# Patient Record
Sex: Female | Born: 1970 | Race: White | Hispanic: No | Smoking: Current every day smoker
Health system: Southern US, Community
[De-identification: ages and names within clinical notes are randomized; demographics above are authoritative.]

## PROBLEM LIST (undated history)

## (undated) DIAGNOSIS — T7840XA Allergy, unspecified, initial encounter: Secondary | ICD-10-CM

## (undated) DIAGNOSIS — M1990 Unspecified osteoarthritis, unspecified site: Secondary | ICD-10-CM

## (undated) DIAGNOSIS — F329 Major depressive disorder, single episode, unspecified: Secondary | ICD-10-CM

## (undated) DIAGNOSIS — M797 Fibromyalgia: Secondary | ICD-10-CM

## (undated) DIAGNOSIS — F32A Depression, unspecified: Secondary | ICD-10-CM

## (undated) DIAGNOSIS — F419 Anxiety disorder, unspecified: Secondary | ICD-10-CM

## (undated) HISTORY — DX: Depression, unspecified: F32.A

## (undated) HISTORY — DX: Unspecified osteoarthritis, unspecified site: M19.90

## (undated) HISTORY — DX: Anxiety disorder, unspecified: F41.9

## (undated) HISTORY — DX: Fibromyalgia: M79.7

## (undated) HISTORY — DX: Major depressive disorder, single episode, unspecified: F32.9

## (undated) HISTORY — DX: Allergy, unspecified, initial encounter: T78.40XA

## (undated) HISTORY — PX: SPINE SURGERY: SHX786

---

## 1998-01-03 ENCOUNTER — Inpatient Hospital Stay (HOSPITAL_COMMUNITY): Admission: AD | Admit: 1998-01-03 | Discharge: 1998-01-03 | Payer: Self-pay | Admitting: Obstetrics & Gynecology

## 2002-09-27 ENCOUNTER — Encounter: Admission: RE | Admit: 2002-09-27 | Discharge: 2002-11-01 | Payer: Self-pay | Admitting: Orthopaedic Surgery

## 2002-10-18 ENCOUNTER — Encounter: Payer: Self-pay | Admitting: Orthopaedic Surgery

## 2002-10-18 ENCOUNTER — Ambulatory Visit (HOSPITAL_COMMUNITY): Admission: RE | Admit: 2002-10-18 | Discharge: 2002-10-19 | Payer: Self-pay | Admitting: Orthopaedic Surgery

## 2002-10-19 ENCOUNTER — Encounter: Payer: Self-pay | Admitting: Orthopaedic Surgery

## 2007-02-22 ENCOUNTER — Ambulatory Visit (HOSPITAL_BASED_OUTPATIENT_CLINIC_OR_DEPARTMENT_OTHER): Admission: RE | Admit: 2007-02-22 | Discharge: 2007-02-22 | Payer: Self-pay | Admitting: Family Medicine

## 2007-02-26 ENCOUNTER — Ambulatory Visit: Payer: Self-pay | Admitting: Internal Medicine

## 2011-01-12 NOTE — Procedures (Signed)
[name], [name]                ACCOUNT NO.:  [id number]   MEDICAL RECORD NO.:  [id number]          PATIENT TYPE:  OUT   LOCATION:  SLEEP CENTER                 FACILITY:  [facility]   PHYSICIAN:  Clinton D. Maple Hudson, MD, FCCP, FACPDATE OF BIRTH:  Apr 16[birth year] 1971   DATE OF STUDY:  02/22/2007                            NOCTURNAL POLYSOMNOGRAM   REFERRING PHYSICIAN:  Domenica Fail   INDICATION FOR STUDY:  Insomnia with sleep apnea.   EPWORTH SLEEPINESS SCORE:  14/24, BMI 43, weight 260 pounds.   HOME MEDICATIONS:  Lyrica.   SLEEP ARCHITECTURE:  Total sleep time 299 minutes with sleep efficiency  70%.  Stage I was 2%, stage II 64%, stages III and IV 16%, REM 19% of  total sleep time.  Sleep latency 97 minutes, REM latency 81 minutes,  awake after sleep onset 31 minutes.  Arousal index 33.9.  Lyrica was  taken at bedtime.  Lights were out at 10:20 p.m. with sleep onset at  11:57 p.m.   RESPIRATORY DATA:  Diagnostic NPSG protocol requested.  Apnea/hypopnea  index (AHI, RDI) 39.7 obstructive events per hour indicating moderate  obstructive sleep apnea/hypopnea syndrome.  This included 1 central  apnea and 197 hypopnea.  All events clustered after 2 a.m.  Most events  occurred while sleeping supine, suggesting a positional correlation.  REM AHI 1.1.   OXYGEN DATA:  Mild to moderate snoring with oxygen desaturation to a  nadir of 76%.  Mean oxygen saturation through the study was 94% on room  air.   CARDIAC DATA:  Normal sinus rhythm.   MOVEMENT-PARASOMNIA:  A total of 83 limb jerks were recorded of which 31  were associated with arousal or awakening for a periodic limb movement  with arousal index of 6.2 per hour which is significant.   IMPRESSIONS-RECOMMENDATIONS:  1. Delayed sleep onset with a sleep latency of 1 hour after lights out      suggesting an insomnia component.  2. Moderate obstructive sleep apnea/hypopnea syndrome, AHI 37.7 per      hour with all events being hypopneas rather  than complete apneas      and most events noted while sleeping supine, suggesting opportunity      for position change to reduce events.  Mild to moderate snoring      with oxygen desaturation to a nadir of 76%.  3. Scores in this range can be addressed with CPAP if more      conservative measures are insufficient by scheduling return for      CPAP titration study if needed.  4. Periodic limb movement with arousal syndrome, 6.2 times per hour.      There might be some benefit obtained from a trial of a specific      therapy such as Requip or Mirapex if appropriate.      Clinton D. Maple Hudson, MD, [facility], FACP  Diplomate, Biomedical engineer of Sleep Medicine  Electronically Signed     CDY/MEDQ  D:  02/26/2007 12:12:26  T:  02/27/2007 02:41:04  Job:  [id number]

## 2011-01-15 NOTE — H&P (Signed)
[name]                          ACCOUNT NO.:  [id number]   MEDICAL RECORD NO.:  [id number]                   PATIENT TYPE:  OIB   LOCATION:  2550                                 FACILITY:  [facility]   PHYSICIAN:  [name], M.D.                     DATE OF BIRTH:  Sep 26, 1970   DATE OF ADMISSION:  10/18/2002  DATE OF DISCHARGE:                                HISTORY & PHYSICAL   CHIEF COMPLAINT:  Neck and right upper extremity pain.   HISTORY OF PRESENT ILLNESS:  The patient is a 40 year old female who has had  interscapular right shoulder and arm pain with numbness into the fingers  which has been going on since September of 2003, beginning back in early  January.  The symptoms got significantly worse.  It got to the point that  she was unable to find any positions of comfort.  This pain was near  constant in nature.  It was severe.  It was interfering with her activities  of daily living and quality of life.  She failed conservative management.  Risks and benefits of proposed surgery were discussed with the patient by  Dr. Sharolyn Campbell, as well as myself.  She indicated understanding and opted to  proceed.   ALLERGIES:  No known drug allergies.   MEDICATIONS:  1. Flexeril p.r.n.  2. Vicodin p.r.n.   PAST MEDICAL HISTORY:  Healthy.   PAST SURGICAL HISTORY:  None.   SOCIAL HISTORY:  The patient smokes one pack of cigarettes per day.  Denies  alcohol use.  She is single.  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  She has two children.  She has a family to help her through her  postoperative course.   FAMILY MEDICAL HISTORY:  Mother alive at age 58 with diabetes.  Father  deceased at age 31 secondary to congestive heart failure.   REVIEW OF SYSTEMS:  Patient denies any fevers, chills, sweats, or bleeding  tendencies.  CNS:  Denies blurred vision, double vision, seizures, headache,  paralysis.  CARDIOVASCULAR:  Denies chest pain, angina, orthopnea,  claudication,  or palpitations.  PULMONARY:  Denies shortness of breath,  productive cough, or hemoptysis.  GI:  Denies nausea, vomiting,  constipation, diarrhea, melena, or __________.  Denies dysuria, hematuria,  discharge.  MUSCULOSKELETAL:  As per HPI.   PHYSICAL EXAMINATION:  VITAL SIGNS:  Blood pressure 120/84, respirations 15  and unlabored, pulse 80 and regular.  GENERAL APPEARANCE:  The patient is a 40 year old white female who is alert  and oriented, in no acute distress.  She is well-nourished, well-groomed.  Appears her stated age.  Pleasant and cooperative to examination.  HEENT:  Head is normocephalic and atraumatic.  Pupils equal, round and  reactive.  Extraocular movements are intact.  Nares are patent.  Pharynx is  clear.  NECK:  Soft to palpation.  No bruits  appreciated.  No lymphadenopathy or  thyromegaly noted.  CHEST:  Clear to auscultation bilaterally.  No rales, rhonchi, wheezes, or  friction rubs.  BREASTS:  Not performed.  HEART:  S1 and S2.  Regular rate and rhythm.  No murmurs, gallops, or rubs  noted.  Distant heart sounds.  ABDOMEN:  Soft to palpation.  Nontender, nondistended.  No organomegaly  noted.  GU:  Not pertinent, not performed.  EXTREMITIES:  Grossly intact motor function and sensation on exam.  Radial  pulses are intact and equal bilaterally.  Skin is intact on evaluation.  MRI  did show C6-7 neural foraminal narrowing and HNP of C6-7.   IMPRESSION:  Herniated nucleus pulposus of C6-7.   PLAN:  1. Admit to [facility] on 10/18/02 for an ACDF of C6-7 to be done by     Dr. Sharolyn Campbell.  2. The patient's primary care physician is Dr. Arvilla Campbell.     [name], P.A.                       [name], M.D.    CM/MEDQ  D:  10/18/2002  T:  10/18/2002  Job:  [id number]

## 2011-01-15 NOTE — Op Note (Signed)
[name], [name]                          ACCOUNT NO.:  [id number]   MEDICAL RECORD NO.:  [id number]                   PATIENT TYPE:  OIB   LOCATION:  3014                                 FACILITY:  [facility]   PHYSICIAN:  Sharolyn Douglas, M.D.                     DATE OF BIRTH:  1971-07-04   DATE OF PROCEDURE:  10/18/2002  DATE OF DISCHARGE:                                 OPERATIVE REPORT   PREOPERATIVE DIAGNOSES:  1. C6-7 herniated nucleus pulposus.  2. Right C7 radiculopathy.   POSTOPERATIVE DIAGNOSES:  1. C6-7 herniated nucleus pulposus.  2. Right C7 radiculopathy.   PROCEDURES:  1. Anterior cervical diskectomy C6-7 with decompression of the spinal cord     and nerve roots.  2. Anterior cervical arthrodesis C6-7 with placement of a 9 mm Synthes     Allograft prosthesis spacer.  3. Anterior cervical plating utilizing the EBI VueLock system C6-7.   SURGEON:  Patricia Nettle, M.D.   ASSISTANT:  Colleen P. Mahar, P.A.-C.   ANESTHESIA:  General endotracheal.   COMPLICATIONS:  None.   INDICATIONS FOR PROCEDURE:  This is a 40 year old female with a 5-6 month  history of progressively worsening right upper extremity pain consistent  with a C7 radiculopathy. She had progressive weakness and decreased  sensation. MRI scan showed a C6-7 disc protrusion. She failed a long and  appropriate course of conservative treatment and elected to undergo anterior  cervical diskectomy and fusion at C6-7 in hopes of improving her  symptomatology.   DESCRIPTION OF PROCEDURE:  The patient was properly identified in the  holding area, taken to the operating room. She underwent general  endotracheal anesthesia without difficulty. She was given prophylactic IV  antibiotics. She was carefully positioned on the operating room table with  the Mayfield headrest. Her neck was placed in slight hyperextension. Five  pounds of halter traction was applied. The neck was prepped and draped in  the usual  sterile fashion.   A 3 cm incision was made in the natural skin crease at the level of the  cricoid cartilage. Dissection was carried down to the platysma. The platysma  was incised sharply. The interval between the strap muscles medially and the  sternocleidomastoid muscle was developed. The carotid sheath, esophagus, and  trachea were identified and protected at all times. The prevertebral space  was entered and identified. The C6-7 level was palpated by the prominent  anterior osteophytes. I replaced the spinal needle at C5-6 so that it could  be visualized on intraoperative x-ray. We were in fact at the C5-6 level.   We then elevated the longus colli muscle bilaterally over the C6-7 disc.  Care was taken to protect the disc above and below. There was a large  overhanging osteophyte. We placed our deep __________ retractor. The  osteophyte was removed with a Leksell rongeur. We performed a  subtotal  diskectomy back to the PLL utilizing curettes and pituitary rongeurs. We  placed Caspar distraction pins and gentle distraction across the C6-7  interspace. We then used a high speed bur to take down the uncovertebral  spur on the right as well as remove the cartilaginous endplate. Disc  material was removed superficial to the PLL on the right side. We then took  down the PLL and removed several small subligamentous pieces of disc again  on the right side. A foraminotomy was completed on the right by using a 2 mm  Kerrison.   When we were done, we could visualize the C7 nerve root, confirm that it was  patent out into the foramen using a blunt probe. We checked the left C7  nerve root and this was found to be patent. The wound was irrigated. We then  placed a 9 mm Synthes Allograft prosthesis spacer that had been packed with  demineralized bone matrix. The prosthesis was carefully tapped into  position. We checked posteriorly with a blunt probe and confirmed that there  was plenty of room  posteriorly. We then placed a 16 mm EBI VueLock plate  with four 12 mm screws. We ensured that each of the locking mechanisms was  engaged. We took an intraoperative x-ray and repeated this three times.  Unfortunately, we could only confirm that we were at the appropriate C6-7  level. Because of the patient's large shoulders, positioning of the plate  and screws could not be determined from the x-ray.   The wound was copiously irrigated. A deep Penrose drain was placed. The  platysma was closed with 2-0 Vicryl suture. The subcutaneous layer was  closed with 3-0 Vicryl followed by a running 4-0 subcuticular Vicryl suture.  Benzoin and Steri-Strips placed. Aspen cervical collar placed.   The patient was extubated without difficulty, transferred to the recovery  room in stable condition able to move her upper and lower extremities.                                               Sharolyn Douglas, M.D.    MC/MEDQ  D:  10/18/2002  T:  10/18/2002  Job:  [id number]

## 2012-08-10 ENCOUNTER — Other Ambulatory Visit: Payer: Self-pay | Admitting: *Deleted

## 2012-11-07 ENCOUNTER — Other Ambulatory Visit: Payer: Self-pay | Admitting: Physician Assistant

## 2012-12-13 ENCOUNTER — Ambulatory Visit: Payer: Self-pay

## 2012-12-13 ENCOUNTER — Ambulatory Visit (INDEPENDENT_AMBULATORY_CARE_PROVIDER_SITE_OTHER): Payer: BC Managed Care – PPO | Admitting: Family Medicine

## 2012-12-13 VITALS — BP 101/69 | HR 77 | Temp 97.9°F | Resp 16 | Ht 67.0 in | Wt 260.0 lb

## 2012-12-13 DIAGNOSIS — R82998 Other abnormal findings in urine: Secondary | ICD-10-CM

## 2012-12-13 DIAGNOSIS — N390 Urinary tract infection, site not specified: Secondary | ICD-10-CM

## 2012-12-13 DIAGNOSIS — R8290 Unspecified abnormal findings in urine: Secondary | ICD-10-CM

## 2012-12-13 DIAGNOSIS — F411 Generalized anxiety disorder: Secondary | ICD-10-CM

## 2012-12-13 DIAGNOSIS — J019 Acute sinusitis, unspecified: Secondary | ICD-10-CM

## 2012-12-13 LAB — POCT UA - MICROSCOPIC ONLY: Casts, Ur, LPF, POC: NEGATIVE

## 2012-12-13 LAB — POCT URINALYSIS DIPSTICK
Blood, UA: NEGATIVE
Glucose, UA: NEGATIVE
Ketones, UA: NEGATIVE
Leukocytes, UA: NEGATIVE
Nitrite, UA: POSITIVE
Protein, UA: NEGATIVE
Urobilinogen, UA: 1
pH, UA: 6

## 2012-12-13 MED ORDER — FLUCONAZOLE 150 MG PO TABS: 150.0000 mg | ORAL_TABLET | Freq: Once | ORAL | Status: DC

## 2012-12-13 MED ORDER — FLUTICASONE PROPIONATE 50 MCG/ACT NA SUSP: 2.0000 | Freq: Every day | NASAL | Status: DC

## 2012-12-13 MED ORDER — LEVOFLOXACIN 500 MG PO TABS: 500.0000 mg | ORAL_TABLET | Freq: Every day | ORAL | Status: DC

## 2012-12-13 MED ORDER — ALPRAZOLAM 0.25 MG PO TABS: 0.2500 mg | ORAL_TABLET | Freq: Two times a day (BID) | ORAL | Status: DC | PRN

## 2012-12-13 NOTE — Progress Notes (Signed)
[facility]:  Office Visit  Chief Complaint:  Chief Complaint  Patient presents with  . Conjunctivitis    Left eye-?'s allergic conjunctivitis-lymph nodes swollen in neck  . Urinary Tract Infection    Strong odor-cloudy-lastg time pt had this she had a UTI-x 2 weeks  . Stress    Pt's mom is in ICU and is having a hard time right now    HPI: [name] is a 42 y.o. female who complains of : 1. UTI sxs-strong odor and cloudy urine x 2 weeks. Denies dysuria, nausea, vomiting. Denies fevers, chills, back pain. Drinking and eating well.  2. 2 mornings with clear dc from left eyes, ears are itchy, sore throat, no cough, no fevers or chills. She has been in ICU with mom. + sinus pressure. No ear pain 3. Has been on Xanax before and was wondering if she can have some for her "nerves" since her mom has been sick. Mom has double PNA, renal failure and possibly new onset DVT, she is really sick. Patient also has a h/o fibromyaglia.   Past Medical History  Diagnosis Date  . Allergy   . Depression   . Arthritis   . Anxiety    Past Surgical History  Procedure Laterality Date  . Spine surgery     History   Social History  . Marital Status: Single    Spouse Name: N/A    Number of Children: N/A  . Years of Education: N/A   Social History Main Topics  . Smoking status: Current Every Day Smoker -- 1.00 packs/day for 25 years  . Smokeless tobacco: None  . Alcohol Use: No  . Drug Use: No  . Sexually Active: Yes    Birth Control/ Protection: IUD   Other Topics Concern  . None   Social History Narrative  . None   Family History  Problem Relation Age of Onset  . Congestive Heart Failure Mother   . COPD Mother   . Kidney disease Mother   . Diabetes Mother    No Known Allergies Prior to Admission medications   Medication Sig Start Date End Date Taking? Authorizing Provider  levonorgestrel (MIRENA) 20 MCG/24HR IUD 1 each by Intrauterine route once.   Yes  Historical Provider, MD     ROS: The patient denies fevers, chills, night sweats, unintentional weight loss, chest pain, palpitations, wheezing, dyspnea on exertion, nausea, vomiting, abdominal pain, dysuria, hematuria, melena, numbness, weakness, or tingling.  All other systems have been reviewed and were otherwise negative with the exception of those mentioned in the HPI and as above.    PHYSICAL EXAM: Filed Vitals:   12/13/12 1317  BP: 101/69  Pulse: 77  Temp: 97.9 F (36.6 C)  Resp: 16   Filed Vitals:   12/13/12 1317  Height: 5\' 7"  (1.702 m)  Weight: 260 lb (117.935 kg)   Body mass index is 40.71 kg/(m^2).  General: Alert, obese white female . , tired appearing HEENT:  Normocephalic, atraumatic, oropharynx patent. TM bulging but no e/o infection, + bil sinus tenderness, no exudates. + erthythema of throat.  Cardiovascular:  Regular rate and rhythm, no rubs murmurs or gallops.  No Carotid bruits, radial pulse intact. No pedal edema.  Respiratory: Clear to auscultation bilaterally.  No wheezes, rales, or rhonchi.  No cyanosis, no use of accessory musculature GI: No organomegaly, abdomen is soft and non-tender, positive bowel sounds.  No masses. Skin: No rashes. Neurologic: Facial musculature symmetric. Psychiatric: Patient  is appropriate throughout our interaction. Lymphatic: No cervical lymphadenopathy Musculoskeletal: Gait intact. No CVA tenderness   LABS: Results for orders placed in visit on 12/13/12  POCT URINALYSIS DIPSTICK      Result Value Range   Color, UA yellow     Clarity, UA clear     Glucose, UA neg     Bilirubin, UA neg     Ketones, UA neg     Spec Grav, UA 1.025     Blood, UA neg     pH, UA 6.0     Protein, UA neg     Urobilinogen, UA 1.0     Nitrite, UA pos     Leukocytes, UA Negative    POCT UA - MICROSCOPIC ONLY      Result Value Range   WBC, Ur, HPF, POC 2-12     RBC, urine, microscopic 3-8     Bacteria, U Microscopic 3+     Mucus, UA  mod     Epithelial cells, urine per micros 0-3     Crystals, Ur, HPF, POC neg     Casts, Ur, LPF, POC neg     Yeast, UA pos       EKG/XRAY:   Primary read interpreted by Dr. Conley Rolls at [facility].   ASSESSMENT/PLAN: Encounter Diagnoses  Name Primary?  . Abnormal urine odor Yes  . UTI (urinary tract infection)   . Acute sinusitis   . GAD (generalized anxiety disorder)    Rx Levaquin 500 mg daily x 10 days for acute sinusitis and also UTI, Diflucan for after she is done with Levaquin Rx Xanax for situational stress/anxiety due to mother's illness Urine cx pending Push fluids F/u prn    Priti Consoli PHUONG, DO 12/13/2012 2:14 PM

## 2012-12-15 LAB — URINE CULTURE: Colony Count: >=100000

## 2013-01-23 ENCOUNTER — Telehealth: Payer: Self-pay

## 2013-01-23 MED ORDER — CIPROFLOXACIN HCL 250 MG PO TABS: 250.0000 mg | ORAL_TABLET | Freq: Two times a day (BID) | ORAL | Status: DC

## 2013-01-23 NOTE — Telephone Encounter (Signed)
Dr Conley Rolls had tried to call pt back. Spoke w/pt and advised her that Dr Conley Rolls is going to send in 3 days of Cipro, but that is the longest she can put her on it d/t a risk of tendon rupture. Advised pt that she needs to RTC is her Sxs remain after taking the Cipro. Pt agreed.

## 2013-01-23 NOTE — Telephone Encounter (Signed)
Pt saw Dr, Conley Rolls last month for a UTI. She took her antibiotics and felt better but once completed UTI is back. She said Dr. Conley Rolls asked her to call if this happened. She has same symptoms and some bleeding-not sure if related (also lost her mother in this timeframe). Wondered if Dr. Conley Rolls would call her in something stronger.  [facility] on [address]  PT [phone]

## 2014-09-03 ENCOUNTER — Ambulatory Visit (INDEPENDENT_AMBULATORY_CARE_PROVIDER_SITE_OTHER): Payer: 59 | Admitting: Family Medicine

## 2014-09-03 ENCOUNTER — Encounter: Payer: Self-pay | Admitting: Family Medicine

## 2014-09-03 VITALS — BP 113/81 | HR 62 | Temp 98.3°F | Resp 16 | Ht 66.5 in | Wt 271.0 lb

## 2014-09-03 DIAGNOSIS — E669 Obesity, unspecified: Secondary | ICD-10-CM

## 2014-09-03 DIAGNOSIS — F17200 Nicotine dependence, unspecified, uncomplicated: Secondary | ICD-10-CM

## 2014-09-03 DIAGNOSIS — R002 Palpitations: Secondary | ICD-10-CM

## 2014-09-03 DIAGNOSIS — Z720 Tobacco use: Secondary | ICD-10-CM

## 2014-09-03 DIAGNOSIS — Z0000 Encounter for general adult medical examination without abnormal findings: Secondary | ICD-10-CM

## 2014-09-03 DIAGNOSIS — Z139 Encounter for screening, unspecified: Secondary | ICD-10-CM

## 2014-09-03 DIAGNOSIS — J302 Other seasonal allergic rhinitis: Secondary | ICD-10-CM

## 2014-09-03 DIAGNOSIS — Z130 Encounter for screening for diseases of the blood and blood-forming organs and certain disorders involving the immune mechanism: Secondary | ICD-10-CM

## 2014-09-03 DIAGNOSIS — Z1389 Encounter for screening for other disorder: Secondary | ICD-10-CM

## 2014-09-03 DIAGNOSIS — L989 Disorder of the skin and subcutaneous tissue, unspecified: Secondary | ICD-10-CM

## 2014-09-03 DIAGNOSIS — Z6841 Body Mass Index (BMI) 40.0 and over, adult: Secondary | ICD-10-CM

## 2014-09-03 LAB — COMPREHENSIVE METABOLIC PANEL
ALT: 13 U/L (ref 0–35)
AST: 13 U/L (ref 0–37)
Albumin: 4 g/dL (ref 3.5–5.2)
Alkaline Phosphatase: 54 U/L (ref 39–117)
BILIRUBIN TOTAL: 0.6 mg/dL (ref 0.2–1.2)
BUN: 10 mg/dL (ref 6–23)
CALCIUM: 8.5 mg/dL (ref 8.4–10.5)
CO2: 21 meq/L (ref 19–32)
CREATININE: 0.7 mg/dL (ref 0.50–1.10)
Chloride: 105 mEq/L (ref 96–112)
GLUCOSE: 94 mg/dL (ref 70–99)
POTASSIUM: 4.3 meq/L (ref 3.5–5.3)
Sodium: 137 mEq/L (ref 135–145)
Total Protein: 6.5 g/dL (ref 6.0–8.3)

## 2014-09-03 LAB — CBC WITH DIFFERENTIAL/PLATELET
BASOS ABS: 0 10*3/uL (ref 0.0–0.1)
Basophils Relative: 0 % (ref 0–1)
EOS ABS: 0.1 10*3/uL (ref 0.0–0.7)
EOS PCT: 1 % (ref 0–5)
HEMATOCRIT: 45.2 % (ref 36.0–46.0)
Hemoglobin: 15.4 g/dL — ABNORMAL HIGH (ref 12.0–15.0)
LYMPHS PCT: 31 % (ref 12–46)
Lymphs Abs: 2.5 10*3/uL (ref 0.7–4.0)
MCH: 30.2 pg (ref 26.0–34.0)
MCHC: 34.1 g/dL (ref 30.0–36.0)
MCV: 88.6 fL (ref 78.0–100.0)
MPV: 11.9 fL (ref 8.6–12.4)
Monocytes Absolute: 0.5 10*3/uL (ref 0.1–1.0)
Monocytes Relative: 6 % (ref 3–12)
NEUTROS ABS: 5 10*3/uL (ref 1.7–7.7)
NEUTROS PCT: 62 % (ref 43–77)
Platelets: 151 10*3/uL (ref 150–400)
RBC: 5.1 MIL/uL (ref 3.87–5.11)
RDW: 13.7 % (ref 11.5–15.5)
WBC: 8.1 10*3/uL (ref 4.0–10.5)

## 2014-09-03 LAB — TSH: TSH: 2.224 u[IU]/mL (ref 0.350–4.500)

## 2014-09-03 LAB — POCT URINALYSIS DIPSTICK
Bilirubin, UA: NEGATIVE
Blood, UA: NEGATIVE
Glucose, UA: NEGATIVE
KETONES UA: NEGATIVE
Nitrite, UA: NEGATIVE
PH UA: 5
PROTEIN UA: NEGATIVE
Spec Grav, UA: 1.015
Urobilinogen, UA: 0.2

## 2014-09-03 LAB — LIPID PANEL
CHOL/HDL RATIO: 5.5 ratio
Cholesterol: 186 mg/dL (ref 0–200)
HDL: 34 mg/dL — AB (ref >39)
LDL Cholesterol: 120 mg/dL — ABNORMAL HIGH (ref 0–99)
TRIGLYCERIDES: 159 mg/dL — AB (ref <150)
VLDL: 32 mg/dL (ref 0–40)

## 2014-09-03 MED ORDER — SERTRALINE HCL 50 MG PO TABS: 50.0000 mg | ORAL_TABLET | Freq: Every day | ORAL | Status: DC

## 2014-09-03 MED ORDER — FLUTICASONE PROPIONATE 50 MCG/ACT NA SUSP: 2.0000 | Freq: Every day | NASAL | Status: AC

## 2014-09-03 MED ORDER — ALPRAZOLAM 0.25 MG PO TABS: 0.2500 mg | ORAL_TABLET | Freq: Two times a day (BID) | ORAL | Status: DC | PRN

## 2014-09-03 NOTE — Progress Notes (Signed)
Subjective:    Patient ID: [name], female    DOB: Jan 08, 1971, 44 y.o.   MRN: [id number]  HPI This is a very pleasant 44 yo female who presents today for CPE.  Last pap- regular with gyn Last mammo- will make her own appointment Flu- declines Tdap- thinks she is up to date, will check Dentist- regularly Exercise- has been working out with a trainer weekly for the last 6 months. Has lost several inches and a clothes size, but has only lost 1 pound. Did not really change her diet until recently. Never has chest pain or unusual SOB with exercise.  She has had increased anxiety and palpitations. She has had a very stressful month. Her husband was out of work until recently. She has about 10 episodes a day, lasting about 30 seconds. She relaxes and the episodes resolve. They have gotten shorter with relaxation. She stopped drinking soda (regular, Mountain Dew,2 liters a day) at the beginning of the year. She stopped this cold [place]. She is drinking some sweet tea.  Has mierena IUD. Has noticed rare hot flashes. Has had sleep disturbance and irritability.   Smokes 1ppd. Has tried to quit in the past. Has been on zoloft which helped with cravings. Is interested in quitting after anxiety/stress improved. Would like to go back on Zoloft to see if it would help with smoking and anxiety. She has had episodes of anxiety in the past. Has used Xanax for sleep and anxiety with good results.  Past Medical History  Diagnosis Date  . Allergy   . Depression   . Arthritis   . Anxiety   . Fibromyalgia    Past Surgical History  Procedure Laterality Date  . Spine surgery     Family History  Problem Relation Age of Onset  . Congestive Heart Failure Mother   . COPD Mother   . Diabetes Mother   . Heart disease Mother   . Hyperlipidemia Mother   . Hypertension Mother   . Diabetes Father   . Heart disease Father   . Hyperlipidemia Father    History  Substance Use Topics  . Smoking status:  Current Every Day Smoker -- 1.00 packs/day for 25 years  . Smokeless tobacco: Not on file  . Alcohol Use: No   Review of Systems  Constitutional: Negative.   HENT: Positive for ear discharge, sinus pressure and sneezing.   Eyes: Negative.   Respiratory: Negative.   Cardiovascular: Negative.   Gastrointestinal: Negative.   Endocrine: Negative.   Genitourinary: Negative.   Musculoskeletal: Negative.   Skin: Negative.   Allergic/Immunologic: Positive for environmental allergies.  Neurological: Negative.   Hematological: Negative.   Psychiatric/Behavioral: Negative.       Objective:   Physical Exam  Constitutional: She is oriented to person, place, and time. She appears well-developed and well-nourished.  HENT:  Head: Normocephalic and atraumatic.  Right Ear: Tympanic membrane, external ear and ear canal normal.  Left Ear: Tympanic membrane, external ear and ear canal normal.  Nose: Mucosal edema present.  Mouth/Throat: Uvula is midline, oropharynx is clear and moist and mucous membranes are normal.  Eyes: Conjunctivae are normal. Pupils are equal, round, and reactive to light.  Neck: Normal range of motion. Neck supple. No JVD present. No thyromegaly present.  Cardiovascular: Normal rate, regular rhythm and normal heart sounds.   Pulmonary/Chest: Effort normal and breath sounds normal.  Abdominal: Soft. Bowel sounds are normal. She exhibits no distension and no mass. There is no  tenderness. There is no rebound and no guarding.  Musculoskeletal: Normal range of motion.  Lymphadenopathy:    She has no cervical adenopathy.  Neurological: She is alert and oriented to person, place, and time. She has normal reflexes.  Skin: Skin is warm and dry.     Psychiatric: She has a normal mood and affect. Her behavior is normal. Judgment and thought content normal.  Vitals reviewed.  BP 113/81 mmHg  Pulse 62  Temp(Src) 98.3 F (36.8 C) (Oral)  Resp 16  Ht 5' 6.5" (1.689 m)  Wt 271 lb  (122.925 kg)  BMI 43.09 kg/m2  SpO2 98% EKG- reviewed with Dr. Cleta Albertsaub- NSR    Assessment & Plan:  1. Annual physical exam  2. BMI 40.0-44.9, adult - Comprehensive metabolic panel - Lipid panel - TSH -provided written and verbal dietary suggestions, encouraging 1/2-1 pound weight loss per week  3. Palpitations - EKG 12-Lead - Comprehensive metabolic panel - Lipid panel  4. Tobacco abuse - Comprehensive metabolic panel  5. Other seasonal allergic rhinitis -has tried flonase in the past, will resume  6. Screening for deficiency anemia - CBC with Differential  7. Screening for nephropathy - CBC with Differential - Comprehensive metabolic panel - Lipid panel  8. Screening for hematuria or proteinuria - POCT urinalysis dipstick  9. Skin lesion of back - Ambulatory referral to Dermatology  10. Anxiety -sertraline 50 mg po qd -Xanax 0.25 mg po BID PRN- discussed potential for dependence and importance to use sparingly -follow up 2 months  Emi Belfasteborah B. Gessner, FNP-BC  [facility], [facility]  09/05/2014 10:08 AM

## 2014-09-03 NOTE — Patient Instructions (Addendum)
Start flonase nasal spray. Use for 1 month to see if it helps Start zoloft, pick stop smoking date for about 3-4 weeks after starting  Smoking Cessation, Tips for Success If you are ready to quit smoking, congratulations! You have chosen to help yourself be healthier. Cigarettes bring nicotine, tar, carbon monoxide, and other irritants into your body. Your lungs, heart, and blood vessels will be able to work better without these poisons. There are many different ways to quit smoking. Nicotine gum, nicotine patches, a nicotine inhaler, or nicotine nasal spray can help with physical craving. Hypnosis, support groups, and medicines help break the habit of smoking. WHAT THINGS CAN I DO TO MAKE QUITTING EASIER?  Here are some tips to help you quit for good:  Pick a date when you will quit smoking completely. Tell all of your friends and family about your plan to quit on that date.  Do not try to slowly cut down on the number of cigarettes you are smoking. Pick a quit date and quit smoking completely starting on that day.  Throw away all cigarettes.   Clean and remove all ashtrays from your home, work, and car.  On a card, write down your reasons for quitting. Carry the card with you and read it when you get the urge to smoke.  Cleanse your body of nicotine. Drink enough water and fluids to keep your urine clear or pale yellow. Do this after quitting to flush the nicotine from your body.  Learn to predict your moods. Do not let a bad situation be your excuse to have a cigarette. Some situations in your life might tempt you into wanting a cigarette.  Never have "just one" cigarette. It leads to wanting another and another. Remind yourself of your decision to quit.  Change habits associated with smoking. If you smoked while driving or when feeling stressed, try other activities to replace smoking. Stand up when drinking your coffee. Brush your teeth after eating. Sit in a different chair when you  read the paper. Avoid alcohol while trying to quit, and try to drink fewer caffeinated beverages. Alcohol and caffeine may urge you to smoke.  Avoid foods and drinks that can trigger a desire to smoke, such as sugary or spicy foods and alcohol.  Ask people who smoke not to smoke around you.  Have something planned to do right after eating or having a cup of coffee. For example, plan to take a walk or exercise.  Try a relaxation exercise to calm you down and decrease your stress. Remember, you may be tense and nervous for the first 2 weeks after you quit, but this will pass.  Find new activities to keep your hands busy. Play with a pen, coin, or rubber band. Doodle or draw things on paper.  Brush your teeth right after eating. This will help cut down on the craving for the taste of tobacco after meals. You can also try mouthwash.   Use oral substitutes in place of cigarettes. Try using lemon drops, carrots, cinnamon sticks, or chewing gum. Keep them handy so they are available when you have the urge to smoke.  When you have the urge to smoke, try deep breathing.  Designate your home as a nonsmoking area.  If you are a heavy smoker, ask your health care provider about a prescription for nicotine chewing gum. It can ease your withdrawal from nicotine.  Reward yourself. Set aside the cigarette money you save and buy yourself something nice.  Look for support from others. Join a support group or smoking cessation program. Ask someone at home or at work to help you with your plan to quit smoking.  Always ask yourself, "Do I need this cigarette or is this just a reflex?" Tell yourself, "Today, I choose not to smoke," or "I do not want to smoke." You are reminding yourself of your decision to quit.  Do not replace cigarette smoking with electronic cigarettes (commonly called e-cigarettes). The safety of e-cigarettes is unknown, and some may contain harmful chemicals.  If you relapse, do not  give up! Plan ahead and think about what you will do the next time you get the urge to smoke. HOW WILL I FEEL WHEN I QUIT SMOKING? You may have symptoms of withdrawal because your body is used to nicotine (the addictive substance in cigarettes). You may crave cigarettes, be irritable, feel very hungry, cough often, get headaches, or have difficulty concentrating. The withdrawal symptoms are only temporary. They are strongest when you first quit but will go away within 10-14 days. When withdrawal symptoms occur, stay in control. Think about your reasons for quitting. Remind yourself that these are signs that your body is healing and getting used to being without cigarettes. Remember that withdrawal symptoms are easier to treat than the major diseases that smoking can cause.  Even after the withdrawal is over, expect periodic urges to smoke. However, these cravings are generally short lived and will go away whether you smoke or not. Do not smoke! WHAT RESOURCES ARE AVAILABLE TO HELP ME QUIT SMOKING? Your health care provider can direct you to community resources or hospitals for support, which may include:  Group support.  Education.  Hypnosis.  Therapy. Document Released: 05/14/2004 Document Revised: 12/31/2013 Document Reviewed: 02/01/2013 [facility] Patient Information 2015 [place], [place]. This information is not intended to replace advice given to you by your health care provider. Make sure you discuss any questions you have with your health care provider.    Why follow it? Research shows. . Those who follow the Mediterranean diet have a reduced risk of heart disease  . The diet is associated with a reduced incidence of Parkinson's and Alzheimer's diseases . People following the diet may have longer life expectancies and lower rates of chronic diseases  . The Dietary Guidelines for Americans recommends the Mediterranean diet as an eating plan to promote health and prevent disease  What Is the  Mediterranean Diet?  . Healthy eating plan based on typical foods and recipes of Mediterranean-style cooking . The diet is primarily a plant based diet; these foods should make up a majority of meals   Starches - Plant based foods should make up a majority of meals - They are an important sources of vitamins, minerals, energy, antioxidants, and fiber - Choose whole grains, foods high in fiber and minimally processed items  - Typical grain sources include wheat, oats, barley, corn, brown rice, bulgar, farro, millet, polenta, couscous  - Various types of beans include chickpeas, lentils, fava beans, black beans, white beans   Fruits  Veggies - Large quantities of antioxidant rich fruits & veggies; 6 or more servings  - Vegetables can be eaten raw or lightly drizzled with oil and cooked  - Vegetables common to the traditional Mediterranean Diet include: artichokes, arugula, beets, broccoli, brussel sprouts, cabbage, carrots, celery, collard greens, cucumbers, eggplant, kale, leeks, lemons, lettuce, mushrooms, okra, onions, peas, peppers, potatoes, pumpkin, radishes, rutabaga, shallots, spinach, sweet potatoes, turnips, zucchini - Fruits common  to the Mediterranean Diet include: apples, apricots, avocados, cherries, clementines, dates, figs, grapefruits, grapes, melons, nectarines, oranges, peaches, pears, pomegranates, strawberries, tangerines  Fats - Replace butter and margarine with healthy oils, such as olive oil, canola oil, and tahini  - Limit nuts to no more than a handful a day  - Nuts include walnuts, almonds, pecans, pistachios, pine nuts  - Limit or avoid candied, honey roasted or heavily salted nuts - Olives are central to the [employer]iterranean diet - can be eaten whole or used in a variety of dishes   Meats Protein - Limiting red meat: no more than a few times a month - When eating red meat: choose lean cuts and keep the portion to the size of deck of cards - Eggs: approx. 0 to 4 times a  week  - Fish and lean poultry: at least 2 a week  - Healthy protein sources include, chicken, [place], lean beef, lamb - Increase intake of seafood such as tuna, salmon, trout, mackerel, shrimp, scallops - Avoid or limit high fat processed meats such as sausage and bacon  Dairy - Include moderate amounts of low fat dairy products  - Focus on healthy dairy such as fat free yogurt, skim milk, low or reduced fat cheese - Limit dairy products higher in fat such as whole or 2% milk, cheese, ice cream  Alcohol - Moderate amounts of red wine is ok  - No more than 5 oz daily for women (all ages) and men older than age 44  - No more than 10 oz of wine daily for men younger than 3665  Other - Limit sweets and other desserts  - Use herbs and spices instead of salt to flavor foods  - Herbs and spices common to the traditional Mediterranean Diet include: basil, bay leaves, chives, cloves, cumin, fennel, garlic, lavender, marjoram, mint, oregano, parsley, pepper, rosemary, sage, savory, sumac, tarragon, thyme   It's not just a diet, it's a lifestyle:  . The Mediterranean diet includes lifestyle factors typical of those in the region  . Foods, drinks and meals are best eaten with others and savored . Daily physical activity is important for overall good health . This could be strenuous exercise like running and aerobics . This could also be more leisurely activities such as walking, housework, yard-work, or taking the stairs . Moderation is the key; a balanced and healthy diet accommodates most foods and drinks . Consider portion sizes and frequency of consumption of certain foods   Meal Ideas & Options:  . Breakfast:  o Whole wheat toast or whole wheat English muffins with peanut butter & hard boiled egg o Steel cut oats topped with apples & cinnamon and skim milk  o Fresh fruit: banana, strawberries, melon, berries, peaches  o Smoothies: strawberries, bananas, greek yogurt, peanut butter o Low fat  greek yogurt with blueberries and granola  o Egg white omelet with spinach and mushrooms o Breakfast couscous: whole wheat couscous, apricots, skim milk, cranberries  . Sandwiches:  o Hummus and grilled vegetables (peppers, zucchini, squash) on whole wheat bread   o Grilled chicken on whole wheat pita with lettuce, tomatoes, cucumbers or tzatziki  o Tuna salad on whole wheat bread: tuna salad made with greek yogurt, olives, red peppers, capers, green onions o Garlic rosemary lamb pita: lamb sauted with garlic, rosemary, salt & pepper; add lettuce, cucumber, greek yogurt to pita - flavor with lemon juice and black pepper  . Seafood:  o Mediterranean grilled salmon, seasoned  with garlic, basil, parsley, lemon juice and black pepper o Shrimp, lemon, and spinach whole-grain pasta salad made with low fat greek yogurt  o Seared scallops with lemon orzo  o Seared tuna steaks seasoned salt, pepper, coriander topped with tomato mixture of olives, tomatoes, olive oil, minced garlic, parsley, green onions and cappers  . Meats:  o Herbed greek chicken salad with kalamata olives, cucumber, feta  o Red bell peppers stuffed with spinach, bulgur, lean ground beef (or lentils) & topped with feta   o Kebabs: skewers of chicken, tomatoes, onions, zucchini, squash  o [place] burgers: made with red onions, mint, dill, lemon juice, feta cheese topped with roasted red peppers . Vegetarian o Cucumber salad: cucumbers, artichoke hearts, celery, red onion, feta cheese, tossed in olive oil & lemon juice  o Hummus and whole grain pita points with a greek salad (lettuce, tomato, feta, olives, cucumbers, red onion) o Lentil soup with celery, carrots made with vegetable broth, garlic, salt and pepper  o Tabouli salad: parsley, bulgur, mint, scallions, cucumbers, tomato, radishes, lemon juice, olive oil, salt and pepper.

## 2014-09-04 ENCOUNTER — Encounter: Payer: Self-pay | Admitting: Family Medicine

## 2014-09-04 ENCOUNTER — Other Ambulatory Visit: Payer: Self-pay | Admitting: Family Medicine

## 2014-09-04 MED ORDER — ATORVASTATIN CALCIUM 10 MG PO TABS: 10.0000 mg | ORAL_TABLET | Freq: Every day | ORAL | Status: DC

## 2014-09-05 DIAGNOSIS — Z6841 Body Mass Index (BMI) 40.0 and over, adult: Secondary | ICD-10-CM

## 2014-09-05 DIAGNOSIS — F17200 Nicotine dependence, unspecified, uncomplicated: Secondary | ICD-10-CM | POA: Insufficient documentation

## 2014-09-09 ENCOUNTER — Other Ambulatory Visit: Payer: Self-pay | Admitting: Family Medicine

## 2014-09-09 DIAGNOSIS — R8290 Unspecified abnormal findings in urine: Secondary | ICD-10-CM

## 2014-09-30 ENCOUNTER — Telehealth: Payer: Self-pay | Admitting: Family Medicine

## 2014-10-01 MED ORDER — ALPRAZOLAM 0.25 MG PO TABS: 0.2500 mg | ORAL_TABLET | Freq: Two times a day (BID) | ORAL | Status: DC | PRN

## 2014-10-01 NOTE — Telephone Encounter (Signed)
Please call patient and tell her that I faxed her refill to her pharmacy.

## 2014-11-05 ENCOUNTER — Ambulatory Visit: Payer: 59 | Admitting: Family Medicine

## 2014-11-13 ENCOUNTER — Ambulatory Visit (INDEPENDENT_AMBULATORY_CARE_PROVIDER_SITE_OTHER): Payer: 59 | Admitting: Family Medicine

## 2014-11-13 ENCOUNTER — Encounter: Payer: Self-pay | Admitting: Family Medicine

## 2014-11-13 VITALS — BP 119/78 | HR 101 | Temp 98.3°F | Resp 18 | Ht 66.0 in | Wt 271.2 lb

## 2014-11-13 DIAGNOSIS — R8299 Other abnormal findings in urine: Secondary | ICD-10-CM | POA: Diagnosis not present

## 2014-11-13 DIAGNOSIS — B373 Candidiasis of vulva and vagina: Secondary | ICD-10-CM

## 2014-11-13 DIAGNOSIS — B3731 Acute candidiasis of vulva and vagina: Secondary | ICD-10-CM

## 2014-11-13 DIAGNOSIS — Z23 Encounter for immunization: Secondary | ICD-10-CM | POA: Diagnosis not present

## 2014-11-13 DIAGNOSIS — Z1239 Encounter for other screening for malignant neoplasm of breast: Secondary | ICD-10-CM

## 2014-11-13 DIAGNOSIS — R05 Cough: Secondary | ICD-10-CM

## 2014-11-13 DIAGNOSIS — F4323 Adjustment disorder with mixed anxiety and depressed mood: Secondary | ICD-10-CM

## 2014-11-13 DIAGNOSIS — R8290 Unspecified abnormal findings in urine: Secondary | ICD-10-CM

## 2014-11-13 DIAGNOSIS — R059 Cough, unspecified: Secondary | ICD-10-CM

## 2014-11-13 LAB — POCT UA - MICROSCOPIC ONLY
Bacteria, U Microscopic: NEGATIVE
CRYSTALS, UR, HPF, POC: NEGATIVE
Casts, Ur, LPF, POC: NEGATIVE
Mucus, UA: NEGATIVE
RBC, URINE, MICROSCOPIC: NEGATIVE
Yeast, UA: NEGATIVE

## 2014-11-13 MED ORDER — VENLAFAXINE HCL ER 37.5 MG PO CP24: 37.5000 mg | ORAL_CAPSULE | Freq: Every day | ORAL | Status: DC

## 2014-11-13 MED ORDER — FLUCONAZOLE 150 MG PO TABS: 150.0000 mg | ORAL_TABLET | Freq: Once | ORAL | Status: DC

## 2014-11-13 MED ORDER — HYDROCODONE-HOMATROPINE 5-1.5 MG/5ML PO SYRP: 5.0000 mL | ORAL_SOLUTION | Freq: Three times a day (TID) | ORAL | Status: DC | PRN

## 2014-11-13 NOTE — Patient Instructions (Signed)
Take 1 effexor daily for 1 week then increase to 2 tablets once a day. Follow up in 2 months Please bring in a 1 week food log  Venlafaxine extended-release capsules What is this medicine? VENLAFAXINE(VEN la fax een) is used to treat depression, anxiety and panic disorder. This medicine may be used for other purposes; ask your health care provider or pharmacist if you have questions. COMMON BRAND NAME(S): Effexor XR What should I tell my health care provider before I take this medicine? They need to know if you have any of these conditions: -bleeding disorders -glaucoma -heart disease -high blood pressure -high cholesterol -kidney disease -liver disease -low levels of sodium in the blood -mania or bipolar disorder -seizures -suicidal thoughts, plans, or attempt; a previous suicide attempt by you or a family -take medicines that treat or prevent blood clots -thyroid disease -an unusual or allergic reaction to venlafaxine, desvenlafaxine, other medicines, foods, dyes, or preservatives -pregnant or trying to get pregnant -breast-feeding How should I use this medicine? Take this medicine by mouth with a full glass of water. Follow the directions on the prescription label. Do not cut, crush, or chew this medicine. Take it with food. If needed, the capsule may be carefully opened and the entire contents sprinkled on a spoonful of cool applesauce. Swallow the applesauce/pellet mixture right away without chewing and follow with a glass of water to ensure complete swallowing of the pellets. Try to take your medicine at about the same time each day. Do not take your medicine more often than directed. Do not stop taking this medicine suddenly except upon the advice of your doctor. Stopping this medicine too quickly may cause serious side effects or your condition may worsen. A special MedGuide will be given to you by the pharmacist with each prescription and refill. Be sure to read this information  carefully each time. Talk to your pediatrician regarding the use of this medicine in children. Special care may be needed. Overdosage: If you think you have taken too much of this medicine contact a poison control center or emergency room at once. NOTE: This medicine is only for you. Do not share this medicine with others. What if I miss a dose? If you miss a dose, take it as soon as you can. If it is almost time for your next dose, take only that dose. Do not take double or extra doses. What may interact with this medicine? Do not take this medicine with any of the following medications: -certain medicines for fungal infections like fluconazole, itraconazole, ketoconazole, posaconazole, voriconazole -cisapride -desvenlafaxine -dofetilide -dronedarone -duloxetine -levomilnacipran -linezolid -MAOIs like Carbex, Eldepryl, Marplan, Nardil, and Parnate -methylene blue (injected into a vein) -milnacipran -pimozide -thioridazine -ziprasidone This medicine may also interact with the following medications: -aspirin and aspirin-like medicines -certain medicines for depression, anxiety, or psychotic disturbances -certain medicines for migraine headaches like almotriptan, eletriptan, frovatriptan, naratriptan, rizatriptan, sumatriptan, zolmitriptan -certain medicines for sleep -certain medicines that treat or prevent blood clots like dalteparin, enoxaparin, warfarin -cimetidine -clozapine -diuretics -fentanyl -furazolidone -indinavir -isoniazid -lithium -metoprolol -NSAIDS, medicines for pain and inflammation, like ibuprofen or naproxen -other medicines that prolong the QT interval (cause an abnormal heart rhythm) -procarbazine -rasagiline -supplements like St. John's wort, kava kava, valerian -tramadol -tryptophan This list may not describe all possible interactions. Give your health care provider a list of all the medicines, herbs, non-prescription drugs, or dietary supplements you  use. Also tell them if you smoke, drink alcohol, or use illegal drugs. Some items  may interact with your medicine. What should I watch for while using this medicine? Tell your doctor if your symptoms do not get better or if they get worse. Visit your doctor or health care professional for regular checks on your progress. Because it may take several weeks to see the full effects of this medicine, it is important to continue your treatment as prescribed by your doctor. Patients and their families should watch out for new or worsening thoughts of suicide or depression. Also watch out for sudden changes in feelings such as feeling anxious, agitated, panicky, irritable, hostile, aggressive, impulsive, severely restless, overly excited and hyperactive, or not being able to sleep. If this happens, especially at the beginning of treatment or after a change in dose, call your health care professional. This medicine can cause an increase in blood pressure. Check with your doctor for instructions on monitoring your blood pressure while taking this medicine. You may get drowsy or dizzy. Do not drive, use machinery, or do anything that needs mental alertness until you know how this medicine affects you. Do not stand or sit up quickly, especially if you are an older patient. This reduces the risk of dizzy or fainting spells. Alcohol may interfere with the effect of this medicine. Avoid alcoholic drinks. Your mouth may get dry. Chewing sugarless gum, sucking hard candy and drinking plenty of water will help. Contact your doctor if the problem does not go away or is severe. What side effects may I notice from receiving this medicine? Side effects that you should report to your doctor or health care professional as soon as possible: -allergic reactions like skin rash, itching or hives, swelling of the face, lips, or tongue -breathing problems -changes in vision -hallucination, loss of contact with  reality -seizures -suicidal thoughts or other mood changes -trouble passing urine or change in the amount of urine -unusual bleeding or bruising Side effects that usually do not require medical attention (report to your doctor or health care professional if they continue or are bothersome): -change in sex drive or performance -constipation -increased sweating -loss of appetite -nausea -tremors -weight loss This list may not describe all possible side effects. Call your doctor for medical advice about side effects. You may report side effects to FDA at 1-800-FDA-1088. Where should I keep my medicine? Keep out of the reach of children. Store at a controlled temperature between 20 and 25 degrees C (68 degrees and 77 degrees F), in a dry place. Throw away any unused medicine after the expiration date. NOTE: This sheet is a summary. It may not cover all possible information. If you have questions about this medicine, talk to your doctor, pharmacist, or health care provider.  2015, [employer]. (2013-03-13 12:46:03)

## 2014-11-13 NOTE — Progress Notes (Signed)
Subjective:    Patient ID: [name], female    DOB: December 25, 1970, 44 y.o.   MRN: [id number]  HPI Patient presents today for follow up of depression and anxiety. She reports that she is not having any more panic attacks. She had to stop Zoloft secondary to nightmares. She is interested in trying another daily medication for her anxiety symptoms. She continues to feel like she is constantly wound up. She is taking Xanax occasionally. Feels very stressed. Has IUD and does not menstruate.   She had sinusitis and and took a course of Augmentin with good resolution of symptoms other than persistent cough. She now has a vaginal yeast infection and requests diflucan. She has also noticed that her urinary symptoms of odor and cloudiness persist. She denies dysuria, hematuria or frequency.   She continues to be frustrated that her weight loss efforts are unsuccessful. She has consistently been working out with a trainer several days a week for longer than 6 months. She feels she is watching what she eats most of the time, trying to incorporate more fruits and vegetables into her diet. She has decreased her portions. Doesn't always sleep well. Usually feels rested if she sleeps through the night.   Review of Systems No fever, no chills, no SOB, no chest pain, +thick white vaginal discharge, +vaginal itching, no vaginal odor.    Objective:   Physical Exam  Constitutional: She is oriented to person, place, and time. She appears well-developed and well-nourished.  HENT:  Head: Normocephalic and atraumatic.  Right Ear: External ear normal.  Left Ear: External ear normal.  Nose: Nose normal.  Mouth/Throat: Oropharynx is clear and moist. No oropharyngeal exudate.  Eyes: Conjunctivae are normal.  Neck: Normal range of motion. Neck supple.  Cardiovascular: Normal rate, regular rhythm and normal heart sounds.   Pulmonary/Chest: Effort normal and breath sounds normal.  Musculoskeletal: Normal range of  motion.  Neurological: She is alert and oriented to person, place, and time.  Skin: Skin is warm and dry.  Psychiatric: She has a normal mood and affect. Her behavior is normal. Judgment and thought content normal.  Brighter mood today.   Vitals reviewed.  BP 119/78 mmHg  Pulse 101  Temp(Src) 98.3 F (36.8 C) (Oral)  Resp 18  Ht  (1.676 m)  Wt 271 lb 3.2 oz (123.016 kg)  BMI 43.79 kg/m2  SpO2 98%     Assessment & Plan:  1. Cloudy urine - POCT UA - Microscopic Only  2. Candidal vaginitis - POCT UA - Microscopic Only - diflucan 150 mg po x 1, may repeat if necessary  3. Cough - hycodan 5 ml q8 h PRN. Patient with hydrocodone intolerance- makes her nauseous. Has tolerated Hycodan in the past.   4. Screening for breast cancer - MM Digital Screening; Future - POCT UA - Microscopic Only  5. Need for Tdap vaccination - Tdap vaccine greater than or equal to 7yo IM - POCT UA - Microscopic Only  6. Flu vaccine need - Flu Vaccine QUAD 36+ mos IM - POCT UA - Microscopic Only  7. Adjustment disorder with mixed anxiety and depressed mood - venlafaxine XR (EFFEXOR XR) 37.5 MG 24 hr capsule; Take 1 capsule (37.5 mg total) by mouth daily with breakfast. Then increase to 2 capsules daily.  Dispense: 60 capsule; Refill: 1 - she will let me know how she does on the Effexor. Discussed importance of self care, adequate sleep, regular exercise, relaxation - follow up in  6-8 weeks  8. Obesity - discussed dietary changes and importance of adequate sleep and stress management - she will bring in a week long food log to her next visit  Emi Belfasteborah B. Gessner, FNP-BC  [facility]  11/16/2014 1:31 PM

## 2014-12-05 ENCOUNTER — Ambulatory Visit: Payer: Self-pay

## 2014-12-17 ENCOUNTER — Telehealth: Payer: Self-pay | Admitting: Family Medicine

## 2014-12-17 NOTE — Telephone Encounter (Signed)
lmom for patient to call us back [name] need her patient to come into office with an earlier appt her clinic will end at [phone] on 01/13/15

## 2014-12-19 ENCOUNTER — Ambulatory Visit
Admission: RE | Admit: 2014-12-19 | Discharge: 2014-12-19 | Disposition: A | Discharge status: Home / Self Care | Payer: Self-pay | Source: Ambulatory Visit | Attending: Family Medicine | Admitting: Family Medicine

## 2014-12-19 DIAGNOSIS — Z1239 Encounter for other screening for malignant neoplasm of breast: Secondary | ICD-10-CM

## 2014-12-20 ENCOUNTER — Other Ambulatory Visit: Payer: Self-pay | Admitting: Family Medicine

## 2014-12-20 DIAGNOSIS — R928 Other abnormal and inconclusive findings on diagnostic imaging of breast: Secondary | ICD-10-CM

## 2014-12-27 ENCOUNTER — Other Ambulatory Visit: Payer: 59

## 2015-01-07 ENCOUNTER — Other Ambulatory Visit: Payer: Self-pay | Admitting: Family Medicine

## 2015-01-07 ENCOUNTER — Other Ambulatory Visit: Payer: Self-pay

## 2015-01-07 DIAGNOSIS — R928 Other abnormal and inconclusive findings on diagnostic imaging of breast: Secondary | ICD-10-CM

## 2015-01-08 ENCOUNTER — Ambulatory Visit
Admission: RE | Admit: 2015-01-08 | Discharge: 2015-01-08 | Disposition: A | Discharge status: Home / Self Care | Payer: 59 | Source: Ambulatory Visit | Attending: Family Medicine | Admitting: Family Medicine

## 2015-01-08 ENCOUNTER — Encounter: Payer: Self-pay | Admitting: Family Medicine

## 2015-01-08 DIAGNOSIS — R928 Other abnormal and inconclusive findings on diagnostic imaging of breast: Secondary | ICD-10-CM

## 2015-01-09 ENCOUNTER — Other Ambulatory Visit: Payer: Self-pay | Admitting: Family Medicine

## 2015-01-09 DIAGNOSIS — F4323 Adjustment disorder with mixed anxiety and depressed mood: Secondary | ICD-10-CM

## 2015-01-09 MED ORDER — VENLAFAXINE HCL ER 75 MG PO CP24: 75.0000 mg | ORAL_CAPSULE | Freq: Every day | ORAL | Status: DC

## 2015-01-13 ENCOUNTER — Ambulatory Visit: Payer: Self-pay | Admitting: Family Medicine

## 2015-01-13 ENCOUNTER — Ambulatory Visit: Payer: 59 | Admitting: Family Medicine

## 2016-02-06 DIAGNOSIS — R0683 Snoring: Secondary | ICD-10-CM | POA: Insufficient documentation

## 2016-02-06 DIAGNOSIS — M797 Fibromyalgia: Secondary | ICD-10-CM | POA: Insufficient documentation

## 2016-05-14 ENCOUNTER — Ambulatory Visit: Payer: 59

## 2016-05-18 ENCOUNTER — Ambulatory Visit (INDEPENDENT_AMBULATORY_CARE_PROVIDER_SITE_OTHER): Payer: Managed Care, Other (non HMO) | Admitting: Family Medicine

## 2016-05-18 VITALS — BP 130/80 | HR 83 | Temp 98.5°F | Resp 17 | Ht 66.0 in | Wt 267.0 lb

## 2016-05-18 DIAGNOSIS — L03012 Cellulitis of left finger: Secondary | ICD-10-CM

## 2016-05-18 DIAGNOSIS — L600 Ingrowing nail: Secondary | ICD-10-CM

## 2016-05-18 MED ORDER — CEPHALEXIN 500 MG PO CAPS: 1000.0000 mg | ORAL_CAPSULE | Freq: Two times a day (BID) | ORAL | 0 refills | Status: DC

## 2016-05-18 NOTE — Progress Notes (Addendum)
Patient ID: [name], female    DOB: 09/27/1970, 45 y.o.   MRN: [id number]  PCP: No primary care provider on file.  Chief Complaint  Patient presents with  . Thumb and toenail problem    Onset 1 year    Subjective:   HPI Presents for evaluation of toe nail pain left great toe and right 2nd toe.  Patient reports a history of both of her great toe nails being removed in the past.  The left great toe re-grew protruding into the lateral side skin of toe. She present today with pain of her left great toe and discomfort of right 2nd toe due to nail has twisted into the skin of the toe. Patient reports attempting to "trim toenail down" but reports increased pain.  Social History   Social History  . Marital status: Single    Spouse name: N/A  . Number of children: N/A  . Years of education: N/A   Occupational History  . 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 Smoking status: Current Every Day Smoker    Packs/day: 1.00    Years: 25.00  . Smokeless tobacco: Not on file  . Alcohol use No  . Drug use: No  . Sexual activity: Yes    Birth control/ protection: IUD   Other Topics Concern  . Not on file   Social History Narrative  . No narrative on file   Family History  Problem Relation Age of Onset  . Congestive Heart Failure Mother   . COPD Mother   . Diabetes Mother   . Heart disease Mother   . Hyperlipidemia Mother   . Hypertension Mother   . Diabetes Father   . Heart disease Father   . Hyperlipidemia Father    Review of Systems See HPI   Patient Active Problem List   Diagnosis Date Noted  . Obesity 09/05/2014  . Tobacco use disorder 09/05/2014     Prior to Admission medications   Medication Sig Start Date End Date Taking? Authorizing Provider  fluticasone (FLONASE) 50 MCG/ACT nasal spray Place 2 sprays into both nostrils daily. 09/03/14  Yes Emi Belfasteborah B Gessner, FNP  levonorgestrel (MIRENA) 20 MCG/24HR IUD 1 each by Intrauterine route once.    Yes Historical Provider, MD  ALPRAZolam (XANAX) 0.25 MG tablet Take 1 tablet (0.25 mg total) by mouth 2 (two) times daily as needed for anxiety. Patient not taking: Reported on 05/18/2016 10/01/14   Emi Belfasteborah B Gessner, FNP  atorvastatin (LIPITOR) 10 MG tablet Take 1 tablet (10 mg total) by mouth daily. Patient not taking: Reported on 05/18/2016 09/04/14   Emi Belfasteborah B Gessner, FNP  sertraline (ZOLOFT) 50 MG tablet Take 1 tablet (50 mg total) by mouth daily. Patient not taking: Reported on 05/18/2016 09/03/14   Emi Belfasteborah B Gessner, FNP  venlafaxine XR (EFFEXOR-XR) 75 MG 24 hr capsule Take 1 capsule (75 mg total) by mouth daily with breakfast. Patient not taking: Reported on 05/18/2016 01/09/15   Emi Belfasteborah B Gessner, FNP     Allergies  Allergen Reactions  . Lyrica [Pregabalin]     Swelling   . Vicodin [Hydrocodone-Acetaminophen]       Objective:  Physical Exam  Constitutional: She is oriented to person, place, and time. She appears well-developed and well-nourished.  HENT:  Head: Normocephalic and atraumatic.  Right Ear: External ear normal.  Left Ear: External ear normal.  Nose: Nose normal.  Eyes: Conjunctivae are normal. Pupils are equal, round, and reactive to  light.  Neck: Normal range of motion. Neck supple.  Cardiovascular: Normal rate.   Pulmonary/Chest: Effort normal and breath sounds normal.  Musculoskeletal: Normal range of motion.  Neurological: She is alert and oriented to person, place, and time.  Skin: Skin is warm and dry.  Deformity of Left Thumb Nail Edematous cuticle. Nail appearance is deviated and grossly deformed, enlarged, compared to right thumb.  Left Great Toe Nail curved inward, top portion of nail partially separated from the nail plate and protruding into skin of the toe.  Right 2nd toe Nail curved inward, top portion of nail partially separated from the nail plate and protruding into the skin of toe.   Psychiatric: She has a normal mood and affect. Her behavior  is normal. Judgment and thought content normal.   Vitals:   05/18/16 1212  BP: 130/80  Pulse: 83  Resp: 17  Temp: 98.5 F (36.9 C)  Toe Nail removal Procedure note: Verbal Consent Obtained. Left great toe wiped with alcohol prep pad, then digital block with 4 cc of 2% Xylocaine with 1 cc of Marcaine. Cleansed and prep with Betadine.  Sterile drape applied. Complete Great Toenail lifted and excised, in its entirety. Proximal aspect of nail bed explored revealing no nail remnants. Hemostasis achieved. Xeroform dressing applied. Cleansed and dressed.  Toe Nail Removal Toe nail removal Procedure note: Verbal Consent Obtained. Right 2nd toe wiped with alcohol prep pad, then digital block with 4 cc of 2% Xylocaine with 1 cc of Marcaine. Cleansed and prep with Betadine.  Sterile drape applied. Complete 2nd toenail lifted and excised, in its entirety. Proximal aspect of nail bed explored revealing no nail remnants. Hemostasis achieved. Xeroform dressing applied. Cleansed and dressed.  Wound care instructions including precautions reviewed with patient.  Assessment & Plan:  1. IGTN (ingrowing toe nail) Patient tolerated procedure TDAP current. If toenail invert upon regrowth, ambulatory referral to podiatry is indicated.  2. Infection of nail bed of finger, left, counseled that the nail was not ingrown. There is likely an abnormality of the nail plate which may be contributing to the deviated growth distribution of the left thumb nail.  - Ambulatory referral to Dermatology  Godfrey Pick. Tiburcio Pea, MSN, FNP-C [facility]

## 2016-05-18 NOTE — Patient Instructions (Addendum)
Woundcare . Keep area clean and dry for 24 hours. Do not remove bandage, if applied. . After 24 hours, remove bandage and wash wound gently with mild soap and warm water. Reapply a new bandage after cleaning wound, if directed. . Continue daily cleansing with soap and water until stitches/staples are removed. . Do not apply any ointments or creams to the wound while stitches/staples are in place, as this may cause delayed healing. . Notify the office if you experience any of the following signs of infection: Swelling, redness, pus drainage, streaking, fever >101.0 F . Notify the office if you experience excessive bleeding that does not stop after 15-20 minutes of constant, firm pressure.    IF you received an x-ray today, you will receive an invoice from [facility]. Please contact [facility] at [phone] with questions or concerns regarding your invoice.   IF you received labwork today, you will receive an invoice from [facility]. Please contact [facility] at [phone] with questions or concerns regarding your invoice.   Our billing staff will not be able to assist you with questions regarding bills from these companies.  You will be contacted with the lab results as soon as they are available. The fastest way to get your results is to activate your My Chart account. Instructions are located on the last page of this paperwork. If you have not heard from us regarding the results in 2 weeks, please contact this office.

## 2016-06-10 DIAGNOSIS — Z23 Encounter for immunization: Secondary | ICD-10-CM | POA: Diagnosis not present

## 2016-06-10 DIAGNOSIS — B379 Candidiasis, unspecified: Secondary | ICD-10-CM | POA: Diagnosis not present

## 2016-06-10 DIAGNOSIS — J0140 Acute pansinusitis, unspecified: Secondary | ICD-10-CM | POA: Diagnosis not present

## 2016-06-10 DIAGNOSIS — T3695XA Adverse effect of unspecified systemic antibiotic, initial encounter: Secondary | ICD-10-CM | POA: Diagnosis not present

## 2016-07-30 DIAGNOSIS — R69 Illness, unspecified: Secondary | ICD-10-CM | POA: Diagnosis not present

## 2016-07-30 DIAGNOSIS — Z0000 Encounter for general adult medical examination without abnormal findings: Secondary | ICD-10-CM | POA: Diagnosis not present

## 2016-07-30 DIAGNOSIS — Z1389 Encounter for screening for other disorder: Secondary | ICD-10-CM | POA: Diagnosis not present

## 2016-07-30 DIAGNOSIS — R5383 Other fatigue: Secondary | ICD-10-CM | POA: Diagnosis not present

## 2016-07-30 DIAGNOSIS — Z87891 Personal history of nicotine dependence: Secondary | ICD-10-CM | POA: Diagnosis not present

## 2016-07-30 DIAGNOSIS — R0602 Shortness of breath: Secondary | ICD-10-CM | POA: Diagnosis not present

## 2016-07-30 DIAGNOSIS — E559 Vitamin D deficiency, unspecified: Secondary | ICD-10-CM | POA: Diagnosis not present

## 2016-08-13 DIAGNOSIS — E559 Vitamin D deficiency, unspecified: Secondary | ICD-10-CM | POA: Diagnosis not present

## 2016-08-13 DIAGNOSIS — E7800 Pure hypercholesterolemia, unspecified: Secondary | ICD-10-CM | POA: Diagnosis not present

## 2016-08-13 DIAGNOSIS — R635 Abnormal weight gain: Secondary | ICD-10-CM | POA: Diagnosis not present

## 2017-04-17 DIAGNOSIS — S91212A Laceration without foreign body of left great toe with damage to nail, initial encounter: Secondary | ICD-10-CM | POA: Diagnosis not present

## 2017-04-17 DIAGNOSIS — L608 Other nail disorders: Secondary | ICD-10-CM | POA: Diagnosis not present

## 2017-04-17 DIAGNOSIS — Z1389 Encounter for screening for other disorder: Secondary | ICD-10-CM | POA: Diagnosis not present

## 2017-04-26 ENCOUNTER — Encounter: Payer: Self-pay | Admitting: Podiatry

## 2017-04-26 ENCOUNTER — Ambulatory Visit (INDEPENDENT_AMBULATORY_CARE_PROVIDER_SITE_OTHER): Payer: Managed Care, Other (non HMO) | Admitting: Podiatry

## 2017-04-26 DIAGNOSIS — L603 Nail dystrophy: Secondary | ICD-10-CM | POA: Diagnosis not present

## 2017-04-26 DIAGNOSIS — L601 Onycholysis: Secondary | ICD-10-CM

## 2017-04-26 NOTE — Patient Instructions (Signed)

## 2017-04-26 NOTE — Progress Notes (Signed)
   Subjective:    Patient ID: [name], female    DOB: 30-Nov-1970, 46 y.o.   MRN: [id number]  HPI  46 year old female presents the office if concerns her left big toenail becoming loose. She states that she hit her toenail on her nephew shoe nail was becoming loose. [place] and she went to urgent care and she is getting ibuprofen as well as antibiotics which she just finished yesterday. She states the nail has become more loose over time since this is his been "tearing" with her sheets and pressure. She said the nails almost off at this point. She denies any drainage or pus. She has also had a history of an injury to the left toenail several years ago which she had a head forward fall on her toe is also had an ingrown toenail that she's had taken off on this side. She has no other concerns today.   Review of Systems  All other systems reviewed and are negative.      Objective:   Physical Exam General: AAO x3, NAD  Dermatological: The left hallux toenails are most complete definitely attached the very proximal nail border. The nail itself is incurvated and the nail is hypertrophic, dystrophic with yellow to brown discoloration. There is no swelling edema, erythema, drainage possibly is no clinical signs of infection present. There is no open lesions or pre-ulcerative lesions of the infected at this time.  Vascular: Dorsalis Pedis artery and Posterior Tibial artery pedal pulses are 2/4 bilateral with immedate capillary fill time. Pedal hair growth present. There is no pain with calf compression, swelling, warmth, erythema.   Neruologic: Grossly intact via light touch bilateral.  Protective threshold with Semmes Wienstein monofilament intact to all pedal sites bilateral.   Musculoskeletal: Tenderness of the left hallux toenail and no other areas of tenderness identified bilaterally. No gross boney pedal deformities bilateral. No pain, crepitus, or limitation noted with foot  and ankle range of motion bilateral. Muscular strength 5/5 in all groups tested bilateral.  Gait: Unassisted, Nonantalgic.     Assessment & Plan:  45 year old female with left hallux onycholysis/onychodystrophy -Treatment options discussed including all alternatives, risks, and complications -Etiology of symptoms were discussed -At this point recommended complete removal of the toenail. Verbal consent was obtained. Under standard conditions a mix of lidocaine and Marcaine plain was infiltrated in a digital block fashion to left hallux toenail. Left hallux toenails prepped. The left hallux toenail was then removed in total without any complications and only very minimal bleeding. Underlying no but appears be healed and there is no open sores identified. Area was cleaned. And buttock ointment and bandages applied. She tolerated well the ankle application. Post injection care was discussed. -I since the toenail for cultures/pathology [facility] labs.  -Discussed the nail will likely so grow back ingrown as well as thickened. She may need partial nail avulsion as the nail grow back in. -RTC 3 weeks or sooner if needed.   Ovid Curd, DPM

## 2017-04-26 NOTE — Addendum Note (Signed)
Addended by: Hadley Pen R on: 04/26/2017 02:26 PM   Modules accepted: Orders

## 2017-05-06 ENCOUNTER — Telehealth: Payer: Self-pay | Admitting: *Deleted

## 2017-05-06 NOTE — Telephone Encounter (Addendum)
-----   Message from Vivi BarrackMatthew R Wagoner, DPM sent at 05/05/2017  4:56 PM EDT ----- Negative for fungus. Please let her know. Will watch to see how the nail grows it. May grow in thicker or discolored form damage.05/06/2017-I informed pt of Dr. Gabriel RungWagoner's review of results and orders.

## 2017-05-17 ENCOUNTER — Ambulatory Visit: Payer: Managed Care, Other (non HMO) | Admitting: Podiatry

## 2017-08-22 DIAGNOSIS — T3695XA Adverse effect of unspecified systemic antibiotic, initial encounter: Secondary | ICD-10-CM | POA: Diagnosis not present

## 2017-08-22 DIAGNOSIS — J0140 Acute pansinusitis, unspecified: Secondary | ICD-10-CM | POA: Diagnosis not present

## 2017-08-22 DIAGNOSIS — B379 Candidiasis, unspecified: Secondary | ICD-10-CM | POA: Diagnosis not present

## 2017-08-22 DIAGNOSIS — H66003 Acute suppurative otitis media without spontaneous rupture of ear drum, bilateral: Secondary | ICD-10-CM | POA: Diagnosis not present

## 2017-11-29 ENCOUNTER — Ambulatory Visit: Payer: 59 | Admitting: Physician Assistant

## 2017-11-29 ENCOUNTER — Other Ambulatory Visit: Payer: Self-pay

## 2017-11-29 ENCOUNTER — Encounter: Payer: Self-pay | Admitting: Physician Assistant

## 2017-11-29 VITALS — BP 134/72 | HR 98 | Temp 98.9°F | Resp 16 | Ht 66.0 in | Wt 268.4 lb

## 2017-11-29 DIAGNOSIS — F4321 Adjustment disorder with depressed mood: Secondary | ICD-10-CM

## 2017-11-29 DIAGNOSIS — F4323 Adjustment disorder with mixed anxiety and depressed mood: Secondary | ICD-10-CM | POA: Insufficient documentation

## 2017-11-29 DIAGNOSIS — R69 Illness, unspecified: Secondary | ICD-10-CM | POA: Diagnosis not present

## 2017-11-29 DIAGNOSIS — F4320 Adjustment disorder, unspecified: Secondary | ICD-10-CM | POA: Insufficient documentation

## 2017-11-29 MED ORDER — VENLAFAXINE HCL ER 37.5 MG PO CP24: 37.5000 mg | ORAL_CAPSULE | Freq: Every day | ORAL | 0 refills | Status: DC

## 2017-11-29 MED ORDER — VENLAFAXINE HCL ER 37.5 MG PO CP24: ORAL_CAPSULE | ORAL | 1 refills | Status: DC

## 2017-11-29 MED ORDER — ALPRAZOLAM 0.25 MG PO TABS: 0.2500 mg | ORAL_TABLET | Freq: Two times a day (BID) | ORAL | 0 refills | Status: DC | PRN

## 2017-11-29 MED ORDER — VENLAFAXINE HCL ER 75 MG PO CP24: 75.0000 mg | ORAL_CAPSULE | Freq: Every day | ORAL | 3 refills | Status: DC

## 2017-11-29 NOTE — Progress Notes (Signed)
Patient ID: Regina ColanderWendy S Campbell, female    DOB: 05/26/1971, 47 y.o.   MRN: [id number]  PCP: Patient, No Pcp Per  Chief Complaint  Patient presents with  . Anxiety    husband passed a month ago, can't sleep     Subjective:   Presents for evaluation of anxiety and grief. 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  Her husband, a patient well known to me, died unexpectedly 10/28/2017 of AMI. He came home from work and told her that he was going to take a shower. After 20 minutes, she wondered what was taking him so long, and found him down, not having made it to the shower. She contacted EMS and he was transported to the hospital (not in our system) where she was advised that his brain had lacked oxygen for too long and that he had brain swelling. She describes herniation, but does not use that term. Her daughter and 2 granddaughters had just moved in with them, so she has family support. 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 Her best friend was with her when she found [name] at home, and continues to be very supportive. She took 2 weeks off work, and while she is glad to be back, has a difficult time staying focused on tasks. Easily distracted by thoughts of Regina Mattendy, continues to cry frequently.  Episodically feels very anxious, with heart palpitations, can't catch her breath,  1/2 alprazolam helps her relax and function, and fall asleep when she can't turn her brain off Previously took sertraline (for stress following her mother's death in 2015 and to reduce cravings for cigarettes, stopped due to nightmares) and venlafaxine (stopped due to financial strain, appears to have worked well based on chart review, but patient doesn't remember taking it).  Review of Systems As above.  Depression screen [facility] 2[facility] 11/29/2017 09/03/2014  Decreased Interest 0 0  Down,  Depressed, Hopeless 0 0  PHQ - 2 Score 0 0    Patient Active Problem List   Diagnosis Date Noted  . Obesity 09/05/2014  . Tobacco use disorder 09/05/2014     Prior to Admission medications   Medication Sig Start Date End Date Taking? Authorizing Provider  ALPRAZolam (XANAX) 0.25 MG tablet Take 1 tablet (0.25 mg total) by mouth 2 (two) times daily as needed for anxiety. 10/01/14  Yes Emi BelfastGessner, Deborah B, FNP  fluticasone (FLONASE) 50 MCG/ACT nasal spray Place 2 sprays into both nostrils daily. 09/03/14  Yes Emi BelfastGessner, Deborah B, FNP  levonorgestrel (MIRENA) 20 MCG/24HR IUD 1 each by Intrauterine route once.   Yes [provider]     Allergies  Allergen Reactions  . Lyrica [Pregabalin]     Swelling   . Vicodin [Hydrocodone-Acetaminophen]        Objective:  Physical Exam  Constitutional: She is oriented to person, place, and time. She appears well-developed and well-nourished. She is active and cooperative. No distress.  BP 134/72   Pulse 98   Temp 98.9 F (37.2 C)   Resp 16   Ht 5\' 6"  (1.676 m)   Wt 268 lb 6.4 oz (121.7 kg)   SpO2 98%   BMI 43.32 kg/m    Eyes: Conjunctivae are normal.  Pulmonary/Chest: Effort normal.  Neurological: She is alert and oriented to person, place, and time.  Psychiatric: Her speech is normal and  behavior is normal. Judgment and thought content normal. Her mood appears not anxious. Her affect is labile. Her affect is not angry, not blunt and not inappropriate. Cognition and memory are normal. She exhibits a depressed mood.   Wt Readings from Last 3 Encounters:  11/29/17 268 lb 6.4 oz (121.7 kg)  05/18/16 267 lb (121.1 kg)  11/13/14 271 lb 3.2 oz (123 kg)       Assessment & Plan:   Problem List Items Addressed This Visit    Adjustment disorder with mixed anxiety and depressed mood   Relevant Medications   ALPRAZolam (XANAX) 0.25 MG tablet   venlafaxine XR (EFFEXOR-XR) 37.5 MG 24 hr capsule   Grief reaction - Primary    Restart  venlafaxine, which chart review reveals she has used successfully in the past, though she does not recall using it. PRN alprazolam. Counseling.          Return in about 2 weeks (around 12/13/2017) for re-evaluation of grief.   Fernande Bras, PA-C [facility]

## 2017-11-29 NOTE — Assessment & Plan Note (Signed)
Restart venlafaxine, which chart review reveals she has used successfully in the past, though she does not recall using it. PRN alprazolam. Counseling.

## 2017-11-29 NOTE — Patient Instructions (Addendum)
For therapy -- [facility] (Beth Kincaid, Ernest McCoy, Heather Kitchens) - [phone] [facility] (Julie Whitt, Terri Bauert) - [phone] [facility] - [phone] [facility] - [phone] Bob Mylan - [phone] Sarah Young - [facility], [phone] [facility]  - [phone] (do not file insurance) Karla Townsend-[phone] Karen Elliot - [phone] Sarah Young - [facility], [phone] [facility] - [phone]   IF you received an x-ray today, you will receive an invoice from [facility]. Please contact  Radiology at [phone] with questions or concerns regarding your invoice.   IF you received labwork today, you will receive an invoice from [facility]. Please contact [facility] at [phone] with questions or concerns regarding your invoice.   Our billing staff will not be able to assist you with questions regarding bills from these companies.  You will be contacted with the lab results as soon as they are available. The fastest way to get your results is to activate your My Chart account. Instructions are located on the last page of this paperwork. If you have not heard from us regarding the results in 2 weeks, please contact this office.      

## 2017-12-01 ENCOUNTER — Encounter: Payer: Self-pay | Admitting: Physician Assistant

## 2017-12-13 ENCOUNTER — Encounter: Payer: Self-pay | Admitting: Physician Assistant

## 2017-12-13 ENCOUNTER — Other Ambulatory Visit: Payer: Self-pay

## 2017-12-13 ENCOUNTER — Ambulatory Visit: Payer: 59 | Admitting: Physician Assistant

## 2017-12-13 VITALS — BP 122/74 | HR 88 | Temp 98.3°F | Resp 16 | Ht 66.0 in | Wt 255.8 lb

## 2017-12-13 DIAGNOSIS — F4321 Adjustment disorder with depressed mood: Secondary | ICD-10-CM

## 2017-12-13 DIAGNOSIS — F4323 Adjustment disorder with mixed anxiety and depressed mood: Secondary | ICD-10-CM

## 2017-12-13 DIAGNOSIS — R69 Illness, unspecified: Secondary | ICD-10-CM | POA: Diagnosis not present

## 2017-12-13 MED ORDER — ALPRAZOLAM 0.25 MG PO TABS: 0.2500 mg | ORAL_TABLET | Freq: Three times a day (TID) | ORAL | 0 refills | Status: DC | PRN

## 2017-12-13 NOTE — Patient Instructions (Signed)
     IF you received an x-ray today, you will receive an invoice from [facility]. Please contact [facility] at [phone] with questions or concerns regarding your invoice.   IF you received labwork today, you will receive an invoice from [facility]. Please contact [facility] at [phone] with questions or concerns regarding your invoice.   Our billing staff will not be able to assist you with questions regarding bills from these companies.  You will be contacted with the lab results as soon as they are available. The fastest way to get your results is to activate your My Chart account. Instructions are located on the last page of this paperwork. If you have not heard from us regarding the results in 2 weeks, please contact this office.     

## 2017-12-13 NOTE — Progress Notes (Signed)
Subjective:    Patient ID: Regina ColanderWendy S Nikkel, female    DOB: 11/18/1970, 47 y.o.   MRN: [id number] Chief Complaint  Patient presents with  . Follow-up    grief    HPI  Has been trying to focus being on her best self and taking care of herself. Doing better at work, able to focus more and get through her day. Reports still having trouble sleeping at night or getting a full nights sleep.  She states venlafaxine has helped her. Feels less emotional. Started taking 75 mg venlafaxine 2 days ago from 37.5mg   Taking alprazolam PRN for anxiety which has been helping.  Reports starting a new diet last week (optavia diet). This has been helpful, meals are already prepared or planned.   Chaplain at work has been helpful (comes to her work every Tuesday). She has very good support system, brother calls daily and son visits 4x/week.  Review of Systems  Constitutional: Negative.   HENT: Negative.   Eyes: Negative.   Respiratory: Negative.   Cardiovascular: Positive for palpitations.  Gastrointestinal: Negative.   Endocrine: Negative.   Genitourinary: Negative.   Hematological: Negative.     Patient Active Problem List   Diagnosis Date Noted  . Adjustment disorder with mixed anxiety and depressed mood 11/29/2017  . Grief reaction 11/29/2017  . Fibromyalgia 02/06/2016  . Snoring 02/06/2016  . Morbid obesity with BMI of 40.0-44.9, adult (HCC) 09/05/2014  . Smoker 09/05/2014   Past Medical History:  Diagnosis Date  . Allergy   . Anxiety   . Arthritis   . Depression   . Fibromyalgia    Prior to Admission medications   Medication Sig Start Date End Date Taking? Authorizing Provider  ALPRAZolam (XANAX) 0.25 MG tablet Take 1 tablet (0.25 mg total) by mouth 2 (two) times daily as needed for anxiety or sleep. 11/29/17  Yes Jeffery, Chelle, PA-C  fluticasone (FLONASE) 50 MCG/ACT nasal spray Place 2 sprays into both nostrils daily. 09/03/14  Yes Emi BelfastGessner, Deborah B, FNP  ibuprofen  (ADVIL,MOTRIN) 800 MG tablet TK 1 T PO TID PRN 04/17/17  Yes [provider]  levonorgestrel (MIRENA) 20 MCG/24HR IUD 1 each by Intrauterine route once.   Yes [provider]  venlafaxine XR (EFFEXOR-XR) 37.5 MG 24 hr capsule Take 1 capsule (37.5 mg total) by mouth daily with breakfast. 11/29/17  Yes Leotis ShamesJeffery, Chelle, PA-C  venlafaxine XR (EFFEXOR-XR) 75 MG 24 hr capsule Take 1 capsule (75 mg total) by mouth daily with breakfast. 11/29/17  Yes Jeffery, Chelle, PA-C   Allergies  Allergen Reactions  . Lyrica [Pregabalin]     Swelling   . Vicodin [Hydrocodone-Acetaminophen]        Objective:   Physical Exam  Constitutional: She is oriented to person, place, and time. She appears well-developed and well-nourished. No distress.  BP 122/74   Pulse 88   Temp 98.3 F (36.8 C)   Resp 16   Ht 5\' 6"  (1.676 m)   Wt 255 lb 12.8 oz (116 kg)   SpO2 96%   BMI 41.29 kg/m    HENT:  Head: Normocephalic and atraumatic.  Eyes: Pupils are equal, round, and reactive to light. Conjunctivae and EOM are normal.  Neck: Normal range of motion. Neck supple. No tracheal deviation present. No thyromegaly present.  Cardiovascular: Normal rate, regular rhythm, normal heart sounds and intact distal pulses. Exam reveals no gallop and no friction rub.  No murmur heard. Pulmonary/Chest: Effort normal and breath sounds normal. No respiratory  distress. She has no wheezes.  Musculoskeletal: Normal range of motion.  Lymphadenopathy:    She has no cervical adenopathy.  Neurological: She is alert and oriented to person, place, and time.  Skin: Skin is warm and dry. She is not diaphoretic.      Office Visit from 12/13/2017 in [facility] at [facility] Total Score  0        Assessment & Plan:  1. Grief reaction See below.  2. Adjustment disorder with mixed anxiety and depressed mood Continue alprazolam 0.25 mg and venlafaxine 75mg . Use therapist options provided at last visit if needed. Try  alprazolam at night to help with sleep.  - ALPRAZolam (XANAX) 0.25 MG tablet; Take 1 tablet (0.25 mg total) by mouth 3 (three) times daily as needed for anxiety or sleep.  Dispense: 60 tablet; Refill: 0

## 2017-12-13 NOTE — Progress Notes (Signed)
Patient ID: Regina ColanderWendy S Campbell, female    DOB: 10/17/1970, 47 y.o.   MRN: [id number]  PCP: Porfirio OarJeffery, Dove Gresham, PA-C  Chief Complaint  Patient presents with  . Follow-up    grief     Subjective:   Presents for evaluation of grief, following the death of her husband and starting venlafaxine.  She is doing better. Feels less emotional on venlafaxine. Just increased the dose from 37.5 mg to 75 mg 2 days ago. Can get through the day better, not having breakdowns at work. Using alprazolam as needed, some days not at all. Continue having trouble with sleep, not getting a full night sleep, doesn't take alprazolam to help her sleep. Talks with the chaplain at work, weekly. Has not scheduled with a therapist, feeling like she has good support. Visits with a good friend and her son has been stopping by 4 times/week.  Using an eating plan, [employer], to help with weight loss. Meals are planned, snacks are provided.    Review of Systems Constitutional: Negative.   HENT: Negative.   Eyes: Negative.   Respiratory: Negative.   Cardiovascular: Positive for palpitations.  Gastrointestinal: Negative.   Endocrine: Negative.   Genitourinary: Negative.   Hematological: Negative.    [facility] 2[facility] 12/13/2017 11/29/2017 09/03/2014  Decreased Interest 0 0 0  Down, Depressed, Hopeless 0 0 0  PHQ - 2 Score 0 0 0       Patient Active Problem List   Diagnosis Date Noted  . Adjustment disorder with mixed anxiety and depressed mood 11/29/2017  . Grief reaction 11/29/2017  . Fibromyalgia 02/06/2016  . Snoring 02/06/2016  . Morbid obesity with BMI of 40.0-44.9, adult (HCC) 09/05/2014  . Smoker 09/05/2014     Prior to Admission medications   Medication Sig Start Date End Date Taking? Authorizing Provider  ALPRAZolam (XANAX) 0.25 MG tablet Take 1 tablet (0.25 mg total) by mouth 2 (two) times daily as needed for anxiety or sleep. 11/29/17  Yes Maleiya Pergola, PA-C  fluticasone (FLONASE) 50  MCG/ACT nasal spray Place 2 sprays into both nostrils daily. 09/03/14  Yes Emi BelfastGessner, Deborah B, FNP  ibuprofen (ADVIL,MOTRIN) 800 MG tablet TK 1 T PO TID PRN 04/17/17  Yes [provider]  levonorgestrel (MIRENA) 20 MCG/24HR IUD 1 each by Intrauterine route once.   Yes [provider]  venlafaxine XR (EFFEXOR-XR) 37.5 MG 24 hr capsule Take 1 capsule (37.5 mg total) by mouth daily with breakfast. 11/29/17  Yes Leotis ShamesJeffery, Brenen Beigel, PA-C  venlafaxine XR (EFFEXOR-XR) 75 MG 24 hr capsule Take 1 capsule (75 mg total) by mouth daily with breakfast. 11/29/17  Yes Anhad Sheeley, PA-C     Allergies  Allergen Reactions  . Lyrica [Pregabalin]     Swelling   . Vicodin [Hydrocodone-Acetaminophen]        Objective:  Physical Exam  Constitutional: She is oriented to person, place, and time. She appears well-developed and well-nourished. She is active and cooperative. No distress.  BP 122/74   Pulse 88   Temp 98.3 F (36.8 C)   Resp 16   Ht 5\' 6"  (1.676 m)   Wt 255 lb 12.8 oz (116 kg)   SpO2 96%   BMI 41.29 kg/m   HENT:  Head: Normocephalic and atraumatic.  Right Ear: Hearing normal.  Left Ear: Hearing normal.  Eyes: Conjunctivae are normal. No scleral icterus.  Neck: Normal range of motion. Neck supple. No thyromegaly present.  Cardiovascular: Normal rate, regular rhythm and normal heart  sounds.  Pulses:      Radial pulses are 2+ on the right side, and 2+ on the left side.  Pulmonary/Chest: Effort normal and breath sounds normal.  Lymphadenopathy:       Head (right side): No tonsillar, no preauricular, no posterior auricular and no occipital adenopathy present.       Head (left side): No tonsillar, no preauricular, no posterior auricular and no occipital adenopathy present.    She has no cervical adenopathy.       Right: No supraclavicular adenopathy present.       Left: No supraclavicular adenopathy present.  Neurological: She is alert and oriented to person, place, and time.  No sensory deficit.  Skin: Skin is warm, dry and intact. No rash noted. No cyanosis or erythema. Nails show no clubbing.  Psychiatric: She has a normal mood and affect. Her speech is normal and behavior is normal.       Wt Readings from Last 3 Encounters:  12/13/17 255 lb 12.8 oz (116 kg)  11/29/17 268 lb 6.4 oz (121.7 kg)  05/18/16 267 lb (121.1 kg)       Assessment & Plan:   Problem List Items Addressed This Visit    Adjustment disorder with mixed anxiety and depressed mood    Doing well on venlafaxine, just increased to 75 mg daily 2 days ago. Continue.      Relevant Medications   ALPRAZolam (XANAX) 0.25 MG tablet   Grief reaction - Primary    Continue current self-care and venlafaxine.          Return in about 3 weeks (around 01/03/2018) for re-evalaution of mood.   Fernande Bras, PA-C Primary Care at [facility]

## 2017-12-17 NOTE — Assessment & Plan Note (Signed)
Doing well on venlafaxine, just increased to 75 mg daily 2 days ago. Continue.

## 2017-12-17 NOTE — Assessment & Plan Note (Signed)
Continue current self-care and venlafaxine.

## 2018-01-03 ENCOUNTER — Ambulatory Visit: Payer: 59 | Admitting: Physician Assistant

## 2018-01-17 ENCOUNTER — Ambulatory Visit (INDEPENDENT_AMBULATORY_CARE_PROVIDER_SITE_OTHER): Payer: 59 | Admitting: Physician Assistant

## 2018-01-17 ENCOUNTER — Other Ambulatory Visit: Payer: Self-pay

## 2018-01-17 ENCOUNTER — Encounter: Payer: Self-pay | Admitting: Physician Assistant

## 2018-01-17 VITALS — BP 120/72 | HR 90 | Temp 98.9°F | Resp 16 | Ht 66.0 in | Wt 245.6 lb

## 2018-01-17 DIAGNOSIS — Z114 Encounter for screening for human immunodeficiency virus [HIV]: Secondary | ICD-10-CM

## 2018-01-17 DIAGNOSIS — Z6841 Body Mass Index (BMI) 40.0 and over, adult: Secondary | ICD-10-CM | POA: Diagnosis not present

## 2018-01-17 DIAGNOSIS — F17200 Nicotine dependence, unspecified, uncomplicated: Secondary | ICD-10-CM

## 2018-01-17 DIAGNOSIS — H65191 Other acute nonsuppurative otitis media, right ear: Secondary | ICD-10-CM | POA: Diagnosis not present

## 2018-01-17 DIAGNOSIS — F4321 Adjustment disorder with depressed mood: Secondary | ICD-10-CM | POA: Diagnosis not present

## 2018-01-17 DIAGNOSIS — R631 Polydipsia: Secondary | ICD-10-CM

## 2018-01-17 DIAGNOSIS — F4323 Adjustment disorder with mixed anxiety and depressed mood: Secondary | ICD-10-CM

## 2018-01-17 DIAGNOSIS — R69 Illness, unspecified: Secondary | ICD-10-CM | POA: Diagnosis not present

## 2018-01-17 LAB — POCT URINALYSIS DIP (MANUAL ENTRY)
Bilirubin, UA: NEGATIVE
GLUCOSE UA: NEGATIVE mg/dL
Nitrite, UA: POSITIVE — AB
Protein Ur, POC: NEGATIVE mg/dL
RBC UA: NEGATIVE
SPEC GRAV UA: 1.02 (ref 1.010–1.025)
UROBILINOGEN UA: 0.2 U/dL
pH, UA: 7.5 (ref 5.0–8.0)

## 2018-01-17 LAB — GLUCOSE, POCT (MANUAL RESULT ENTRY): POC Glucose: 90 mg/dl (ref 70–99)

## 2018-01-17 MED ORDER — AZELASTINE HCL 0.1 % NA SOLN: 2.0000 | Freq: Two times a day (BID) | NASAL | 0 refills | Status: AC

## 2018-01-17 MED ORDER — ALPRAZOLAM 0.25 MG PO TABS: 0.2500 mg | ORAL_TABLET | Freq: Three times a day (TID) | ORAL | 0 refills | Status: DC | PRN

## 2018-01-17 MED ORDER — VENLAFAXINE HCL ER 150 MG PO CP24: 150.0000 mg | ORAL_CAPSULE | Freq: Every day | ORAL | 3 refills | Status: DC

## 2018-01-17 MED ORDER — ALPRAZOLAM 0.25 MG PO TABS
0.2500 mg | ORAL_TABLET | Freq: Three times a day (TID) | ORAL | 0 refills | Status: DC | PRN
Start: 2018-01-17 — End: 2018-03-17

## 2018-01-17 NOTE — Progress Notes (Signed)
Patient ID: [name], female    DOB: 1970-12-27, 47 y.o.   MRN: [id number]  PCP: Porfirio Oar, PA-C  Chief Complaint  Patient presents with  . Mood    doing better     Subjective:   Presents for evaluation of grief reaction, following her husband's unexpected death in 11-16-22.  She is taking venlafaxine 75 mg. Still needing alprazolam at HS, sometimes a second dose.  She is working on self care, healthy lifestyle changes. Using a subscription eating plan, [employer].  Hired an attorney who helped her consolidate her debt into one monthly payment. Her boss paid the attorney fees. Overwhelmed by the love and support she is receiving  2 days of popping in the RIGHT ear. History of recurrent AOM, with resulting scar tissue. She notes that she often cannot tell if she has an infection until it's severe due to altered sensation.  Health Maintenance Due  Topic Date Due  . HIV Screening  08/06/1986  . PAP SMEAR  10/14/2016    Review of Systems As above.  Depression screen [facility] 2/9 01/17/2018 12/13/2017 11/29/2017 09/03/2014  Decreased Interest 0 0 0 0  Down, Depressed, Hopeless 0 0 0 0  PHQ - 2 Score 0 0 0 0     Patient Active Problem List   Diagnosis Date Noted  . Adjustment disorder with mixed anxiety and depressed mood 11/29/2017  . Grief reaction 11/29/2017  . Fibromyalgia 02/06/2016  . Snoring 02/06/2016  . Morbid obesity with BMI of 40.0-44.9, adult (HCC) 09/05/2014  . Smoker 09/05/2014     Prior to Admission medications   Medication Sig Start Date End Date Taking? Authorizing Provider  ALPRAZolam (XANAX) 0.25 MG tablet Take 1 tablet (0.25 mg total) by mouth 3 (three) times daily as needed for anxiety or sleep. 12/13/17  Yes Jaylynn Mcaleer, PA-C  fluticasone (FLONASE) 50 MCG/ACT nasal spray Place 2 sprays into both nostrils daily. 09/03/14  Yes Emi Belfast, FNP  ibuprofen (ADVIL,MOTRIN) 800 MG tablet TK 1 T PO TID PRN 04/17/17  Yes [provider]  levonorgestrel (MIRENA) 20 MCG/24HR IUD 1 each by Intrauterine route once.   Yes [provider]  venlafaxine XR (EFFEXOR-XR) 75 MG 24 hr capsule Take 1 capsule (75 mg total) by mouth daily with breakfast. 11/29/17  Yes Leotis Shames, Mahala Rommel, PA-C     Allergies  Allergen Reactions  . Lyrica [Pregabalin]     Swelling   . Vicodin [Hydrocodone-Acetaminophen]        Objective:  Physical Exam  Constitutional: She is oriented to person, place, and time. She appears well-developed and well-nourished. She is active and cooperative. No distress.  BP 120/72   Pulse 90   Temp 98.9 F (37.2 C)   Resp 16   Ht  (1.676 m)   Wt 245 lb 9.6 oz (111.4 kg)   SpO2 97%   BMI 39.64 kg/m   HENT:  Head: Normocephalic and atraumatic.  Right Ear: Hearing, external ear and ear canal normal. A middle ear effusion (retraction pocket 6 o'clock-8 o'clock) is present.  Left Ear: Hearing, tympanic membrane, external ear and ear canal normal.  Nose: Mucosal edema present. No rhinorrhea, nose lacerations, sinus tenderness, nasal deformity, septal deviation or nasal septal hematoma. No epistaxis.  No foreign bodies.  Mouth/Throat: Uvula is midline, oropharynx is clear and moist and mucous membranes are normal.  Eyes: Conjunctivae are normal. No scleral icterus.  Neck: Normal range of motion. Neck supple. No thyromegaly  present.  Cardiovascular: Normal rate, regular rhythm and normal heart sounds.  Pulses:      Radial pulses are 2+ on the right side, and 2+ on the left side.  Pulmonary/Chest: Effort normal and breath sounds normal.  Lymphadenopathy:       Head (right side): No tonsillar, no preauricular, no posterior auricular and no occipital adenopathy present.       Head (left side): No tonsillar, no preauricular, no posterior auricular and no occipital adenopathy present.    She has no cervical adenopathy.       Right: No supraclavicular adenopathy present.       Left: No supraclavicular  adenopathy present.  Neurological: She is alert and oriented to person, place, and time. No sensory deficit.  Skin: Skin is warm, dry and intact. No rash noted. No cyanosis or erythema. Nails show no clubbing.  Psychiatric: She has a normal mood and affect. Her speech is normal and behavior is normal.    Results for orders placed or performed in visit on 01/17/18  POCT glucose (manual entry)  Result Value Ref Range   POC Glucose 90 70 - 99 mg/dl  POCT urinalysis dipstick  Result Value Ref Range   Color, UA yellow yellow   Clarity, UA clear clear   Glucose, UA negative negative mg/dL   Bilirubin, UA negative negative   Ketones, POC UA trace (5) (A) negative mg/dL   Spec Grav, UA 1.610 9.604 - 1.025   Blood, UA negative negative   pH, UA 7.5 5.0 - 8.0   Protein Ur, POC negative negative mg/dL   Urobilinogen, UA 0.2 0.2 or 1.0 E.U./dL   Nitrite, UA Positive (A) Negative   Leukocytes, UA Trace (A) Negative      Wt Readings from Last 3 Encounters:  01/17/18 245 lb 9.6 oz (111.4 kg)  12/13/17 255 lb 12.8 oz (116 kg)  11/29/17 268 lb 6.4 oz (121.7 kg)       Assessment & Plan:   Problem List Items Addressed This Visit    Smoker    Continue to encourage smoking cessation.      Morbid obesity with BMI of 40.0-44.9, adult [facility])    She continues to find success with a subscription meal service.      Grief reaction - Primary   Adjustment disorder with mixed anxiety and depressed mood    Improving, but continues to need alprazolam every night, some nights the second dose.  Increase venlafaxine XR from 75 mg to 150 mg daily.  Continue alprazolam as needed, hope to see decreased need.      Relevant Medications   venlafaxine XR (EFFEXOR-XR) 150 MG 24 hr capsule   ALPRAZolam (XANAX) 0.25 MG tablet   ALPRAZolam (XANAX) 0.25 MG tablet (Start on 02/16/2018)    Other Visit Diagnoses    Increased thirst       Reassuring normal nonfasting urine.  Continue to hydrate.   Relevant  Orders   Comprehensive metabolic panel   POCT glucose (manual entry) (Completed)   POCT urinalysis dipstick (Completed)   Urine Culture   Acute effusion of right ear       Continue Flonase.  Add azelastine nasal spray twice daily.   Relevant Medications   azelastine (ASTELIN) 0.1 % nasal spray   Screening for HIV (human immunodeficiency virus)       Relevant Orders   HIV antibody       Return in about 2 months (around 03/19/2018) for Annual Exam.  Fara Chute, PA-C Primary Care at [place]

## 2018-01-17 NOTE — Assessment & Plan Note (Signed)
She continues to find success with a subscription meal service.

## 2018-01-17 NOTE — Assessment & Plan Note (Signed)
Continue to encourage smoking cessation. 

## 2018-01-17 NOTE — Patient Instructions (Addendum)
Add the azelastine twice daily. Use it 5-10 minutes before the Flonase, to help the Flonase work better.  INCREASE the venlafaxine from 75 mg to 150 mg. You can use up any 150 mg that you have left by taking 2 together.    IF you received an x-ray today, you will receive an invoice from [facility]. Please contact [facility] at [phone] with questions or concerns regarding your invoice.   IF you received labwork today, you will receive an invoice from [facility]. Please contact [facility] at [phone] with questions or concerns regarding your invoice.   Our billing staff will not be able to assist you with questions regarding bills from these companies.  You will be contacted with the lab results as soon as they are available. The fastest way to get your results is to activate your My Chart account. Instructions are located on the last page of this paperwork. If you have not heard from [place] regarding the results in 2 weeks, please contact this office.

## 2018-01-17 NOTE — Assessment & Plan Note (Signed)
Improving, but continues to need alprazolam every night, some nights the second dose.  Increase venlafaxine XR from 75 mg to 150 mg daily.  Continue alprazolam as needed, hope to see decreased need.

## 2018-01-18 LAB — COMPREHENSIVE METABOLIC PANEL
ALK PHOS: 54 IU/L (ref 39–117)
ALT: 23 IU/L (ref 0–32)
AST: 19 IU/L (ref 0–40)
Albumin/Globulin Ratio: 2 (ref 1.2–2.2)
Albumin: 4.3 g/dL (ref 3.5–5.5)
BILIRUBIN TOTAL: 0.3 mg/dL (ref 0.0–1.2)
BUN / CREAT RATIO: 28 — AB (ref 9–23)
BUN: 18 mg/dL (ref 6–24)
CO2: 21 mmol/L (ref 20–29)
CREATININE: 0.64 mg/dL (ref 0.57–1.00)
Calcium: 9.1 mg/dL (ref 8.7–10.2)
Chloride: 102 mmol/L (ref 96–106)
GFR calc Af Amer: 124 mL/min/{1.73_m2} (ref >59)
GFR calc non Af Amer: 107 mL/min/{1.73_m2} (ref >59)
GLUCOSE: 86 mg/dL (ref 65–99)
Globulin, Total: 2.1 g/dL (ref 1.5–4.5)
Potassium: 4.2 mmol/L (ref 3.5–5.2)
Sodium: 138 mmol/L (ref 134–144)
Total Protein: 6.4 g/dL (ref 6.0–8.5)

## 2018-01-18 LAB — HIV ANTIBODY (ROUTINE TESTING W REFLEX): HIV SCREEN 4TH GENERATION: NONREACTIVE

## 2018-01-19 LAB — URINE CULTURE

## 2018-01-19 MED ORDER — NITROFURANTOIN MONOHYD MACRO 100 MG PO CAPS: 100.0000 mg | ORAL_CAPSULE | Freq: Two times a day (BID) | ORAL | 0 refills | Status: AC

## 2018-01-19 NOTE — Addendum Note (Signed)
Addended by: Fernande Bras on: 01/19/2018 02:57 PM   Modules accepted: Orders

## 2018-02-09 DIAGNOSIS — N390 Urinary tract infection, site not specified: Secondary | ICD-10-CM | POA: Diagnosis not present

## 2018-03-17 ENCOUNTER — Ambulatory Visit (INDEPENDENT_AMBULATORY_CARE_PROVIDER_SITE_OTHER): Payer: 59 | Admitting: Family Medicine

## 2018-03-17 ENCOUNTER — Other Ambulatory Visit: Payer: Self-pay

## 2018-03-17 ENCOUNTER — Encounter: Payer: Self-pay | Admitting: Family Medicine

## 2018-03-17 VITALS — BP 106/74 | HR 98 | Temp 98.6°F | Ht 66.54 in | Wt 220.8 lb

## 2018-03-17 DIAGNOSIS — Z716 Tobacco abuse counseling: Secondary | ICD-10-CM | POA: Diagnosis not present

## 2018-03-17 DIAGNOSIS — Z1231 Encounter for screening mammogram for malignant neoplasm of breast: Secondary | ICD-10-CM

## 2018-03-17 DIAGNOSIS — Z01419 Encounter for gynecological examination (general) (routine) without abnormal findings: Secondary | ICD-10-CM

## 2018-03-17 DIAGNOSIS — Z13220 Encounter for screening for lipoid disorders: Secondary | ICD-10-CM | POA: Diagnosis not present

## 2018-03-17 DIAGNOSIS — Z1329 Encounter for screening for other suspected endocrine disorder: Secondary | ICD-10-CM

## 2018-03-17 DIAGNOSIS — F17210 Nicotine dependence, cigarettes, uncomplicated: Secondary | ICD-10-CM | POA: Diagnosis not present

## 2018-03-17 DIAGNOSIS — Z130 Encounter for screening for diseases of the blood and blood-forming organs and certain disorders involving the immune mechanism: Secondary | ICD-10-CM | POA: Diagnosis not present

## 2018-03-17 DIAGNOSIS — Z13228 Encounter for screening for other metabolic disorders: Secondary | ICD-10-CM | POA: Diagnosis not present

## 2018-03-17 MED ORDER — NICOTINE 21 MG/24HR TD PT24: 21.0000 mg | MEDICATED_PATCH | Freq: Every day | TRANSDERMAL | 0 refills | Status: DC

## 2018-03-17 NOTE — Patient Instructions (Addendum)
1  800 quit now    IF you received an x-ray today, you will receive an invoice from [facility]. Please contact [facility] at [phone] with questions or concerns regarding your invoice.   IF you received labwork today, you will receive an invoice from [facility]. Please contact [facility] at [phone] with questions or concerns regarding your invoice.   Our billing staff will not be able to assist you with questions regarding bills from these companies.  You will be contacted with the lab results as soon as they are available. The fastest way to get your results is to activate your My Chart account. Instructions are located on the last page of this paperwork. If you have not heard from us regarding the results in 2 weeks, please contact this office.     Coping with Quitting Smoking Quitting smoking is a physical and mental challenge. You will face cravings, withdrawal symptoms, and temptation. Before quitting, work with your health care provider to make a plan that can help you cope. Preparation can help you quit and keep you from giving in. How can I cope with cravings? Cravings usually last for 5-10 minutes. If you get through it, the craving will pass. Consider taking the following actions to help you cope with cravings:  Keep your mouth busy: ? Chew sugar-free gum. ? Suck on hard candies or a straw. ? Brush your teeth.  Keep your hands and body busy: ? Immediately change to a different activity when you feel a craving. ? Squeeze or play with a ball. ? Do an activity or a hobby, like making bead jewelry, practicing needlepoint, or working with wood. ? Mix up your normal routine. ? Take a short exercise break. Go for a quick walk or run up and down stairs. ? Spend time in public places where smoking is not allowed.  Focus on doing something kind or helpful for someone else.  Call a friend or family member to talk during a craving.  Join a support  group.  Call a quit line, such as 1-800-QUIT-NOW.  Talk with your health care provider about medicines that might help you cope with cravings and make quitting easier for you.  How can I deal with withdrawal symptoms? Your body may experience negative effects as it tries to get used to not having nicotine in the system. These effects are called withdrawal symptoms. They may include:  Feeling hungrier than normal.  Trouble concentrating.  Irritability.  Trouble sleeping.  Feeling depressed.  Restlessness and agitation.  Craving a cigarette.  To manage withdrawal symptoms:  Avoid places, people, and activities that trigger your cravings.  Remember why you want to quit.  Get plenty of sleep.  Avoid coffee and other caffeinated drinks. These may worsen some of your symptoms.  How can I handle social situations? Social situations can be difficult when you are quitting smoking, especially in the first few weeks. To manage this, you can:  Avoid parties, bars, and other social situations where people might be smoking.  Avoid alcohol.  Leave right away if you have the urge to smoke.  Explain to your family and friends that you are quitting smoking. Ask for understanding and support.  Plan activities with friends or family where smoking is not an option.  What are some ways I can cope with stress? Wanting to smoke may cause stress, and stress can make you want to smoke. Find ways to manage your stress. Relaxation techniques can help. For example:  Breathe slowly and deeply, in through your nose and out through your mouth.  Listen to soothing, relaxing music.  Talk with a family member or friend about your stress.  Light a candle.  Soak in a bath or take a shower.  Think about a peaceful place.  What are some ways I can prevent weight gain? Be aware that many people gain weight after they quit smoking. However, not everyone does. To keep from gaining weight, have a  plan in place before you quit and stick to the plan after you quit. Your plan should include:  Having healthy snacks. When you have a craving, it may help to: ? Eat plain popcorn, crunchy carrots, celery, or other cut vegetables. ? Chew sugar-free gum.  Changing how you eat: ? Eat small portion sizes at meals. ? Eat 4-6 small meals throughout the day instead of 1-2 large meals a day. ? Be mindful when you eat. Do not watch television or do other things that might distract you as you eat.  Exercising regularly: ? Make time to exercise each day. If you do not have time for a long workout, do short bouts of exercise for 5-10 minutes several times a day. ? Do some form of strengthening exercise, like weight lifting, and some form of aerobic exercise, like running or swimming.  Drinking plenty of water or other low-calorie or no-calorie drinks. Drink 6-8 glasses of water daily, or as much as instructed by your health care provider.  Summary  Quitting smoking is a physical and mental challenge. You will face cravings, withdrawal symptoms, and temptation to smoke again. Preparation can help you as you go through these challenges.  You can cope with cravings by keeping your mouth busy (such as by chewing gum), keeping your body and hands busy, and making calls to family, friends, or a helpline for people who want to quit smoking.  You can cope with withdrawal symptoms by avoiding places where people smoke, avoiding drinks with caffeine, and getting plenty of rest.  Ask your health care provider about the different ways to prevent weight gain, avoid stress, and handle social situations. This information is not intended to replace advice given to you by your health care provider. Make sure you discuss any questions you have with your health care provider. Document Released: 08/13/2016 Document Revised: 08/13/2016 Document Reviewed: 08/13/2016 Elsevier Interactive Patient Education  [employer].

## 2018-03-17 NOTE — Progress Notes (Signed)
7/19/20199:10 AM  [name] [birth year], 47 y.o. female [id number]  Chief Complaint  Patient presents with  . Annual Exam    having pap done as well, Has in iud. Would like for it to be taking out    HPI:   Patient is a 47 y.o. female with past medical history significant for obesity and smoking who presents today for CPE  Last CPE several years ago Cervical Cancer Screening: 3-4 years ago, normal per patient, remotes abnormal s/p cryo, G3P3, IUD 6 years ago + menses Breast Cancer Screening: 2 years ago, normal, aunt with BrCa no fhx of ovarian cancer Colorectal Cancer Screening: n/a, no fhx  Bone Density Testing: n/a HIV Screening: 12/2017 Seasonal Influenza Vaccination: this season Td/Tdap Vaccination: November 29, 2014 Pneumococcal Vaccination: n/a Zoster Vaccination: n/a Frequency of Dental evaluation: Q6 months Frequency of Eye evaluation: none, has no vision concerns  Has life coach for weight loss, doing diet and exercise Husband died in 04-01-2019she is coping ok, has good support, focusing on being healthy She is ready to quit smoking, 1ppd, 20 years, has quit several times before, does really well with patches, not with wellbutrin or chantix, last attempt she did not smoke for almost 4 months  She has no acute concerns today  Fall Risk  03/17/2018 01/17/2018 12/13/2017 11/29/2017 09/03/2014  Falls in the past year? No No No No No     Depression screen [facility] 2/9 03/17/2018 01/17/2018 12/13/2017  Decreased Interest 0 0 0  Down, Depressed, Hopeless 0 0 0  PHQ - 2 Score 0 0 0    Allergies  Allergen Reactions  . Pregabalin Swelling    Swelling   . Vicodin [Hydrocodone-Acetaminophen]     Prior to Admission medications   Medication Sig Start Date End Date Taking? Authorizing Provider  ALPRAZolam (XANAX) 0.25 MG tablet Take 1 tablet (0.25 mg total) by mouth 3 (three) times daily as needed for anxiety. 02/16/18  Yes Jeffery, Chelle, PA-C  azelastine (ASTELIN) 0.1 % nasal spray  Place 2 sprays into both nostrils 2 (two) times daily. Use in each nostril as directed 01/17/18  Yes Jeffery, Chelle, PA-C  fluticasone (FLONASE) 50 MCG/ACT nasal spray Place 2 sprays into both nostrils daily. 09/03/14  Yes Elby Beck, FNP  ibuprofen (ADVIL,MOTRIN) 800 MG tablet TK 1 T PO TID PRN 04/17/17  Yes [provider]  levonorgestrel (MIRENA) 20 MCG/24HR IUD 1 each by Intrauterine route once.   Yes [provider]  venlafaxine XR (EFFEXOR-XR) 150 MG 24 hr capsule Take 1 capsule (150 mg total) by mouth daily with breakfast. 01/17/18  Yes Jeffery, Chelle, PA-C  ALPRAZolam (XANAX) 0.25 MG tablet Take 1 tablet (0.25 mg total) by mouth 3 (three) times daily as needed for anxiety or sleep. 01/17/18   Harrison Mons, PA-C    Past Medical History:  Diagnosis Date  . Allergy   . Anxiety   . Arthritis   . Depression   . Fibromyalgia     Past Surgical History:  Procedure Laterality Date  . SPINE SURGERY      Social History   Tobacco Use  . Smoking status: Current Every Day Smoker    Packs/day: 1.00    Years: 25.00    Pack years: 25.00  . Smokeless tobacco: Never Used  Substance Use Topics  . Alcohol use: No    Family History  Problem Relation Age of Onset  . Congestive Heart Failure Mother   . COPD Mother   .  Diabetes Mother   . Heart disease Mother   . Hyperlipidemia Mother   . Hypertension Mother   . Diabetes Father   . Heart disease Father   . Hyperlipidemia Father   . Alcohol abuse Brother     Review of Systems  Constitutional: Negative for chills, fever and malaise/fatigue.  HENT: Positive for congestion. Negative for ear pain, sinus pain and sore throat.   Eyes: Negative for blurred vision and double vision.  Respiratory: Negative for cough and shortness of breath.   Cardiovascular: Negative for chest pain, palpitations and leg swelling.  Gastrointestinal: Negative for abdominal pain, nausea and vomiting.  Genitourinary: Negative for  dysuria and hematuria.       Neg breast lumps or nipple discharge Neg vaginal discharge, pelvic pain or dyspareunia  Musculoskeletal: Negative for joint pain and myalgias.  Neurological: Negative for dizziness, tingling and headaches.  Endo/Heme/Allergies: Negative for polydipsia.  Psychiatric/Behavioral: Negative for substance abuse. The patient does not have insomnia.   All other systems reviewed and are negative.    OBJECTIVE:  Blood pressure 106/74, pulse 98, temperature 98.6 F (37 C), temperature source Oral, height 5' 6.54" (1.69 m), weight 220 lb 12.8 oz (100.2 kg), SpO2 96 %.  Wt Readings from Last 3 Encounters:  03/17/18 220 lb 12.8 oz (100.2 kg)  01/17/18 245 lb 9.6 oz (111.4 kg)  12/13/17 255 lb 12.8 oz (116 kg)    Visual Acuity Screening   Right eye Left eye Both eyes  Without correction: 20/25 20/30 20/20   With correction:        Physical Exam  Constitutional: She is oriented to person, place, and time. She appears well-developed and well-nourished.  HENT:  Head: Normocephalic and atraumatic.  Right Ear: Hearing, tympanic membrane, external ear and ear canal normal.  Left Ear: Hearing, tympanic membrane, external ear and ear canal normal.  Mouth/Throat: Oropharynx is clear and moist.  Eyes: Pupils are equal, round, and reactive to light. Conjunctivae and EOM are normal.  Neck: Neck supple. No thyromegaly present.  Cardiovascular: Normal rate, regular rhythm, normal heart sounds and intact distal pulses. Exam reveals no gallop and no friction rub.  No murmur heard. Pulmonary/Chest: Effort normal and breath sounds normal. She has no wheezes. She has no rhonchi. She has no rales. Right breast exhibits no inverted nipple, no mass, no nipple discharge, no skin change and no tenderness. Left breast exhibits no inverted nipple, no mass, no nipple discharge, no skin change and no tenderness. Breasts are symmetrical.  Abdominal: Soft. Bowel sounds are normal. She exhibits  no distension. There is no hepatosplenomegaly. There is no tenderness. There is no guarding.  Genitourinary: There is no rash or lesion on the right labia. There is no rash or lesion on the left labia. Uterus is not enlarged, not fixed and not tender. Cervix exhibits no motion tenderness, no discharge and no friability. Right adnexum displays no mass and no tenderness. Left adnexum displays no mass and no tenderness. No erythema in the vagina. No vaginal discharge found.  Genitourinary Comments: IUD strings visualized at [facility]  Musculoskeletal: Normal range of motion. She exhibits no edema.  Lymphadenopathy:    She has no cervical adenopathy.    She has no axillary adenopathy.       Right: No supraclavicular adenopathy present.       Left: No supraclavicular adenopathy present.  Neurological: She is alert and oriented to person, place, and time. She has normal reflexes.  Skin: Skin is warm and dry.  Psychiatric: She has a normal mood and affect.  Nursing note and vitals reviewed.   ASSESSMENT and PLAN  1. Women's annual routine gynecological examination No concerns per history or exam. Routine HCM labs ordered. HCM reviewed/discussed. Anticipatory guidance regarding healthy weight, lifestyle and choices given.  - Pap IG w/ reflex to HPV when ASC-U  2. Screening for deficiency anemia - CBC  3. Screening for lipid disorders - Lipid panel  4. Screening for thyroid disorder - TSH  5. Screening for metabolic disorder - Comprehensive metabolic panel  6. Visit for screening mammogram - MM DIGITAL SCREENING BILATERAL; Future  7. Tobacco abuse counseling Smoking cessation instruction/counseling given for more than 10 minutes:  counseled patient on the dangers of tobacco use, advised patient to stop smoking, and reviewed strategies to maximize success - nicotine (NICODERM CQ - DOSED IN MG/24 HOURS) 21 mg/24hr patch; Place 1 patch (21 mg total) onto the skin daily.  Return for IUD removal  and reinsterion/ smoking cessation.    Rutherford Guys, MD Primary Care at [place], [place] [address] Ph.  [phone] Fax [phone]

## 2018-03-18 LAB — LIPID PANEL
Chol/HDL Ratio: 4 ratio (ref 0.0–4.4)
Cholesterol, Total: 151 mg/dL (ref 100–199)
HDL: 38 mg/dL — ABNORMAL LOW (ref >39)
LDL Calculated: 93 mg/dL (ref 0–99)
Triglycerides: 102 mg/dL (ref 0–149)
VLDL Cholesterol Cal: 20 mg/dL (ref 5–40)

## 2018-03-18 LAB — CBC
Hematocrit: 46.4 % (ref 34.0–46.6)
Hemoglobin: 15.7 g/dL (ref 11.1–15.9)
MCH: 30.5 pg (ref 26.6–33.0)
MCHC: 33.8 g/dL (ref 31.5–35.7)
MCV: 90 fL (ref 79–97)
Platelets: 143 10*3/uL — ABNORMAL LOW (ref 150–450)
RBC: 5.15 x10E6/uL (ref 3.77–5.28)
RDW: 13.5 % (ref 12.3–15.4)
WBC: 9.3 10*3/uL (ref 3.4–10.8)

## 2018-03-18 LAB — TSH: TSH: 0.971 u[IU]/mL (ref 0.450–4.500)

## 2018-03-18 LAB — COMPREHENSIVE METABOLIC PANEL
ALT: 59 IU/L — ABNORMAL HIGH (ref 0–32)
AST: 36 IU/L (ref 0–40)
Albumin/Globulin Ratio: 1.6 (ref 1.2–2.2)
Albumin: 4.2 g/dL (ref 3.5–5.5)
Alkaline Phosphatase: 61 IU/L (ref 39–117)
BUN/Creatinine Ratio: 17 (ref 9–23)
BUN: 12 mg/dL (ref 6–24)
Bilirubin Total: 0.4 mg/dL (ref 0.0–1.2)
CO2: 20 mmol/L (ref 20–29)
Calcium: 9 mg/dL (ref 8.7–10.2)
Chloride: 104 mmol/L (ref 96–106)
Creatinine, Ser: 0.69 mg/dL (ref 0.57–1.00)
GFR calc Af Amer: 121 mL/min/{1.73_m2} (ref >59)
GFR calc non Af Amer: 105 mL/min/{1.73_m2} (ref >59)
Globulin, Total: 2.6 g/dL (ref 1.5–4.5)
Glucose: 90 mg/dL (ref 65–99)
Potassium: 4.1 mmol/L (ref 3.5–5.2)
Sodium: 140 mmol/L (ref 134–144)
Total Protein: 6.8 g/dL (ref 6.0–8.5)

## 2018-03-22 ENCOUNTER — Encounter: Payer: Self-pay | Admitting: Family Medicine

## 2018-03-22 LAB — PAP IG W/ RFLX HPV ASCU: PAP Smear Comment: 0

## 2018-03-23 NOTE — Telephone Encounter (Signed)
Pt is calling to get an update. Please advise. [phone]#[phone]

## 2018-03-28 ENCOUNTER — Telehealth: Payer: Self-pay | Admitting: *Deleted

## 2018-03-28 NOTE — Telephone Encounter (Signed)
Message sent to patient

## 2018-03-28 NOTE — Telephone Encounter (Signed)
Dr. Leretha PolSantiago,       I have ordered the Mirena, can you release labs .    first I just wanted to take a minute and say thank you for being my new Dr. I felt very comforatable with you just like I did with Chelle.   Just checking to see when my test results would be in? And also find out if my Mirena has been ordered so I can make and appointment.    Thanks in advance for your time.

## 2018-03-29 NOTE — Telephone Encounter (Signed)
I sent patient a message with her results and next steps.

## 2018-03-30 ENCOUNTER — Encounter: Payer: Self-pay | Admitting: Family Medicine

## 2018-04-06 ENCOUNTER — Other Ambulatory Visit: Payer: Self-pay

## 2018-04-06 ENCOUNTER — Encounter: Payer: Self-pay | Admitting: Family Medicine

## 2018-04-06 ENCOUNTER — Ambulatory Visit: Payer: 59 | Admitting: Family Medicine

## 2018-04-06 VITALS — BP 111/75 | HR 74 | Temp 97.5°F | Ht 67.0 in | Wt 227.4 lb

## 2018-04-06 DIAGNOSIS — N938 Other specified abnormal uterine and vaginal bleeding: Secondary | ICD-10-CM

## 2018-04-06 DIAGNOSIS — Z30433 Encounter for removal and reinsertion of intrauterine contraceptive device: Secondary | ICD-10-CM | POA: Diagnosis not present

## 2018-04-06 DIAGNOSIS — Z3202 Encounter for pregnancy test, result negative: Secondary | ICD-10-CM | POA: Diagnosis not present

## 2018-04-06 DIAGNOSIS — R87618 Other abnormal cytological findings on specimens from cervix uteri: Secondary | ICD-10-CM

## 2018-04-06 DIAGNOSIS — Z113 Encounter for screening for infections with a predominantly sexual mode of transmission: Secondary | ICD-10-CM

## 2018-04-06 LAB — POCT URINE PREGNANCY: Preg Test, Ur: NEGATIVE

## 2018-04-06 MED ORDER — DOXYCYCLINE HYCLATE 100 MG PO TABS: 100.0000 mg | ORAL_TABLET | Freq: Two times a day (BID) | ORAL | 0 refills | Status: AC

## 2018-04-06 NOTE — Progress Notes (Signed)
8/8/201912:46 PM  Regina CarasWendy S Campbell 06/17/1971, 47 y.o. female [id number]  Chief Complaint  Patient presents with  . Contraception    IUD REMOVAL, BIOPSY AND IUD INSERTION    HPI:   Patient is a 47 y.o. female with past medical history significant for obesity, DUB, smoking and endometrial cells on pap who presents today for removal and reinsertion of Mirena IUD with endometrial bx. Had new sexual partner.  Fall Risk  04/06/2018 03/17/2018 01/17/2018 12/13/2017 11/29/2017  Falls in the past year? No No No No No     [facility] 04/06/2018 03/17/2018 01/17/2018  Decreased Interest 0 0 0  Down, Depressed, Hopeless 0 0 0  PHQ - 2 Score 0 0 0    Allergies  Allergen Reactions  . Pregabalin Swelling    Swelling   . Vicodin [Hydrocodone-Acetaminophen]     Prior to Admission medications   Medication Sig Start Date End Date Taking? Authorizing Provider  ALPRAZolam (XANAX) 0.25 MG tablet Take 1 tablet (0.25 mg total) by mouth 3 (three) times daily as needed for anxiety. 02/16/18  Yes Jeffery, Chelle, PA-C  azelastine (ASTELIN) 0.1 % nasal spray Place 2 sprays into both nostrils 2 (two) times daily. Use in each nostril as directed 01/17/18  Yes Jeffery, Chelle, PA-C  fluticasone (FLONASE) 50 MCG/ACT nasal spray Place 2 sprays into both nostrils daily. 09/03/14  Yes Emi BelfastGessner, Deborah B, FNP  ibuprofen (ADVIL,MOTRIN) 800 MG tablet TK 1 T PO TID PRN 04/17/17  Yes [provider]  levonorgestrel (MIRENA) 20 MCG/24HR IUD 1 each by Intrauterine route once.   Yes [provider]  nicotine (NICODERM CQ - DOSED IN MG/24 HOURS) 21 mg/24hr patch Place 1 patch (21 mg total) onto the skin daily. 03/17/18  Yes Myles LippsSantiago, Rigdon Macomber M, MD  venlafaxine XR (EFFEXOR-XR) 150 MG 24 hr capsule Take 1 capsule (150 mg total) by mouth daily with breakfast. 01/17/18  Yes Porfirio OarJeffery, Chelle, PA-C    Past Medical History:  Diagnosis Date  . Allergy   . Anxiety   . Arthritis   . Depression   .  Fibromyalgia     Past Surgical History:  Procedure Laterality Date  . SPINE SURGERY      Social History   Tobacco Use  . Smoking status: Current Every Day Smoker    Packs/day: 1.00    Years: 25.00    Pack years: 25.00  . Smokeless tobacco: Never Used  Substance Use Topics  . Alcohol use: No    Family History  Problem Relation Age of Onset  . Congestive Heart Failure Mother   . COPD Mother   . Diabetes Mother   . Heart disease Mother   . Hyperlipidemia Mother   . Hypertension Mother   . Diabetes Father   . Heart disease Father   . Hyperlipidemia Father   . Alcohol abuse Brother     Review of Systems  Constitutional: Negative for chills and fever.  Respiratory: Negative for cough and shortness of breath.   Cardiovascular: Negative for chest pain, palpitations and leg swelling.  Gastrointestinal: Negative for abdominal pain, nausea and vomiting.  Genitourinary: Negative for dysuria and hematuria.       Neg vaginal discharge and pelvic pain Pos irregular heavy bleeding for past several months    OBJECTIVE:  Blood pressure 111/75, pulse 74, temperature (!) 97.5 F (36.4 C), temperature source Oral, height 5\' 7"  (1.702 m), weight 227 lb 6.4 oz (103.1 kg), SpO2 96 %. Body mass index  is 35.62 kg/m.   Physical Exam  Constitutional: She appears well-developed and well-nourished.  Genitourinary: There is no rash or lesion on the right labia. There is no rash or lesion on the left labia. Uterus is not fixed and not tender. Cervix exhibits discharge (dark blood). Right adnexum displays no tenderness and no fullness. Left adnexum displays no tenderness and no fullness. No erythema in the vagina. No vaginal discharge found.  Nursing note and vitals reviewed.     Results for orders placed or performed in visit on 04/06/18 (from the past 24 hour(s))  POCT urine pregnancy     Status: Normal   Collection Time: 04/06/18 12:49 PM  Result Value Ref Range   Preg Test, Ur  Negative Negative    PROCEDURE NOTE Verbal consent obtained. She understood risks of endometrial biopsy, IUD removal,  placement to include bleeding, infection, uterine perforation, risk of expulsion, risk of failure < 1%, increased risk of ectopic pregnancy in the event of failure.  A bimanual exam was performed to establish the position of the uterus.  A speculum was placed in the vagina and the cervix was cleaned with Betadine.  IUD removed easily with ring forceps A single tooth tenaculum was placed at the anterior lip of the cervix and the uterus was sounded to 8 cm.   The endometrial biopsy pipette was easily inserted into the uterine cavity and endometrial tissue was easily retrieved.  The IUD was then carefully inserted into the uterus. The Mirena device was released into the uterine cavity and the applicator withdrawn. The string was cut to the appropriate length.  The tenaculum was removed and the speculum withdrawn. The patient tolerated the procedure well.  LOT: [id number] Exp: Nov 2021  ASSESSMENT and PLAN    ICD-10-CM   1. Encounter for IUD removal and reinsertion Z30.433 POCT urine pregnancy    GC/Chlamydia Probe Amp([facility])  2. Unexplained endometrial cells on cervical Pap smear R87.618 Pathology Report  3. DUB (dysfunctional uterine bleeding) N93.8 GC/Chlamydia Probe Amp([facility])    Pathology Report  4. Screen for STD (sexually transmitted disease) Z11.3 GC/Chlamydia Probe Amp([facility])  5. Pregnancy examination or test, negative result Z32.02     Patient tolerated procedures well. Routine post care instructions and  precautions given.  3 day of doxycycline course given   Return in about 6 weeks (around 05/18/2018) for IUD check, sooner pending bx results.    Myles Lipps, MD Primary Care at [facility] [address] [place], [place] [address] Ph.  [phone] Fax [phone]

## 2018-04-06 NOTE — Patient Instructions (Signed)
     IF you received an x-ray today, you will receive an invoice from [facility]. Please contact [facility] at [phone] with questions or concerns regarding your invoice.   IF you received labwork today, you will receive an invoice from [facility]. Please contact [facility] at [phone] with questions or concerns regarding your invoice.   Our billing staff will not be able to assist you with questions regarding bills from these companies.  You will be contacted with the lab results as soon as they are available. The fastest way to get your results is to activate your My Chart account. Instructions are located on the last page of this paperwork. If you have not heard from us regarding the results in 2 weeks, please contact this office.     

## 2018-04-08 LAB — GC/CHLAMYDIA PROBE AMP
Chlamydia trachomatis, NAA: NEGATIVE
Neisseria gonorrhoeae by PCR: NEGATIVE

## 2018-04-10 LAB — PATHOLOGY

## 2018-04-13 ENCOUNTER — Ambulatory Visit: Payer: 59 | Admitting: Emergency Medicine

## 2018-04-13 ENCOUNTER — Other Ambulatory Visit: Payer: Self-pay

## 2018-04-13 ENCOUNTER — Ambulatory Visit: Payer: Self-pay | Admitting: Family Medicine

## 2018-04-13 ENCOUNTER — Encounter: Payer: Self-pay | Admitting: Emergency Medicine

## 2018-04-13 VITALS — BP 104/69 | HR 78 | Temp 98.4°F | Ht 61.0 in | Wt 226.4 lb

## 2018-04-13 DIAGNOSIS — R55 Syncope and collapse: Secondary | ICD-10-CM | POA: Diagnosis not present

## 2018-04-13 LAB — COMPREHENSIVE METABOLIC PANEL
ALBUMIN: 4.2 g/dL (ref 3.5–5.5)
ALK PHOS: 57 IU/L (ref 39–117)
ALT: 15 IU/L (ref 0–32)
AST: 15 IU/L (ref 0–40)
Albumin/Globulin Ratio: 1.7 (ref 1.2–2.2)
BILIRUBIN TOTAL: 0.3 mg/dL (ref 0.0–1.2)
BUN / CREAT RATIO: 26 — AB (ref 9–23)
BUN: 18 mg/dL (ref 6–24)
CO2: 20 mmol/L (ref 20–29)
Calcium: 8.9 mg/dL (ref 8.7–10.2)
Chloride: 103 mmol/L (ref 96–106)
Creatinine, Ser: 0.69 mg/dL (ref 0.57–1.00)
GFR calc Af Amer: 121 mL/min/{1.73_m2} (ref >59)
GFR calc non Af Amer: 105 mL/min/{1.73_m2} (ref >59)
Globulin, Total: 2.5 g/dL (ref 1.5–4.5)
Glucose: 86 mg/dL (ref 65–99)
Potassium: 4.6 mmol/L (ref 3.5–5.2)
Sodium: 139 mmol/L (ref 134–144)
Total Protein: 6.7 g/dL (ref 6.0–8.5)

## 2018-04-13 LAB — CBC WITH DIFFERENTIAL/PLATELET
BASOS ABS: 0 10*3/uL (ref 0.0–0.2)
Basos: 0 %
EOS (ABSOLUTE): 0.1 10*3/uL (ref 0.0–0.4)
Eos: 1 %
HEMOGLOBIN: 15.4 g/dL (ref 11.1–15.9)
Hematocrit: 44.6 % (ref 34.0–46.6)
Immature Grans (Abs): 0 10*3/uL (ref 0.0–0.1)
Immature Granulocytes: 0 %
LYMPHS ABS: 2.1 10*3/uL (ref 0.7–3.1)
Lymphs: 23 %
MCH: 31.8 pg (ref 26.6–33.0)
MCHC: 34.5 g/dL (ref 31.5–35.7)
MCV: 92 fL (ref 79–97)
MONOCYTES: 6 %
Monocytes Absolute: 0.6 10*3/uL (ref 0.1–0.9)
NEUTROS ABS: 6.5 10*3/uL (ref 1.4–7.0)
Neutrophils: 70 %
Platelets: 145 10*3/uL — ABNORMAL LOW (ref 150–450)
RBC: 4.84 x10E6/uL (ref 3.77–5.28)
RDW: 14.7 % (ref 12.3–15.4)
WBC: 9.3 10*3/uL (ref 3.4–10.8)

## 2018-04-13 LAB — HEMOGLOBIN A1C
Est. average glucose Bld gHb Est-mCnc: 105 mg/dL
HEMOGLOBIN A1C: 5.3 % (ref 4.8–5.6)

## 2018-04-13 NOTE — Telephone Encounter (Signed)
Pt called and stated that she fell and 04/11/18. Pt now has hip pain I schedule pt for an appointment today at 1020 please advise      Patient had fainting episode this week- it was unexpected and it lasted only seconds- she could her her surrounding. She did fall and her body hit the ground. She is having hip/bottock pain. Appointment made today for assessment. Reason for Disposition . [1] All other patients AND [2] now alert and feels fine(Exception: SIMPLE FAINT due to stress, pain, prolonged standing, or suddenly standing)  Answer Assessment - Initial Assessment Questions 1. ONSET: "How long were you unconscious?" (minutes) "When did it happen?"     Less than a minutes- 04/11/18 2. CONTENT: "What happened during period of unconsciousness?" (e.g., seizure activity)      Some staggering/off balance- patient was walking outside 3. MENTAL STATUS: "Alert and oriented now?" (oriented x 3 = name, month, location)      Alert now- and was alert when she can too 4. TRIGGER: "What do you think caused the fainting?" "What were you doing just before you fainted?"  (e.g., exercise, sudden standing up, prolonged standing)     Patient had just had hair colored and was walking the neighbor home 5. RECURRENT SYMPTOM: "Have you ever passed out before?" If so, ask: "When was the last time?" and "What happened that time?"      Yes- years ago 6. INJURY: "Did you sustain any injury during the fall?"      Patient did hit head left hip/buttock- fall was broken by friend 7. CARDIAC SYMPTOMS: "Have you had any of the following symptoms: chest pain, difficulty breathing, palpitations?"     no 8. NEUROLOGIC SYMPTOMS: "Have you had any of the following symptoms: headache, numbness, vertigo, weakness?"     no 9. GI SYMPTOMS: "Have you had any of the following symptoms: abdominal pain, vomiting, diarrhea, blood in stools?"     no 10. OTHER SYMPTOMS: "Do you have any other symptoms?"       Sore buttock/hip area, neck  is stiff on left, arm is sore 11. PREGNANCY: "Is there any chance you are pregnant?" "When was your last menstrual period?"       IUD  Protocols [facility]: [facility]

## 2018-04-13 NOTE — Progress Notes (Signed)
[name] 47 y.o.   Chief Complaint  Patient presents with  . Fall    possible fainting, yet coherent in hearing just not able to verbalize. Happened 10 years ago the exact same way. Has pain on the left side of the body.  Taking aleve and tylenol for the pain. On a special diet, not sure if that has anything to do with the symptoms    HISTORY OF PRESENT ILLNESS: This is a 47 y.o. female complaining of passing out 2 days ago.  Patient states that on Tuesday morning while taking a shower she felt something popped in her left low back area.  Had pain thereafter but a fairly normal afternoon.  In the evening, at around 9 or 10 PM felt very lightheaded and briefly passed out.  Fell down on the ground with no significant injuries.  States she thinks she was aware of conversations around her at all time.  However friends stated she was out for close to a minute.  She recovered and went to sit down.  Had an episode of diaphoresis.  Fully recovered after 10 to 15 minutes.  Spend the whole day sleeping yesterday.  This morning at work left leg was shaking a bit supervisor asked her to come to the doctor and be checked.  Denies any other significant symptoms.  No cardiac history.  Had a similar event 10 years ago.  Has been on a particular diet for the past 3 to 4 months.  Non-smoker no EtOH abuser.  No new medications.  Denies drug use.  HPI   Prior to Admission medications   Medication Sig Start Date End Date Taking? Authorizing Provider  ALPRAZolam (XANAX) 0.25 MG tablet Take 1 tablet (0.25 mg total) by mouth 3 (three) times daily as needed for anxiety. 02/16/18  Yes Jeffery, Chelle, PA-C  azelastine (ASTELIN) 0.1 % nasal spray Place 2 sprays into both nostrils 2 (two) times daily. Use in each nostril as directed 01/17/18  Yes Jeffery, Chelle, PA-C  doxycycline (VIBRA-TABS) 100 MG tablet Take 1 tablet (100 mg total) by mouth 2 (two) times daily. 04/06/18  Yes Myles LippsSantiago, Irma M, MD  fluticasone  [facility](FLONASE) 50 MCG/ACT nasal spray Place 2 sprays into both nostrils daily. 09/03/14  Yes Emi BelfastGessner, Deborah B, FNP  ibuprofen (ADVIL,MOTRIN) 800 MG tablet TK 1 T PO TID PRN 04/17/17  Yes [provider]  levonorgestrel (MIRENA) 20 MCG/24HR IUD 1 each by Intrauterine route once.   Yes [provider]  nicotine (NICODERM CQ - DOSED IN MG/24 HOURS) 21 mg/24hr patch Place 1 patch (21 mg total) onto the skin daily. 03/17/18  Yes Myles LippsSantiago, Irma M, MD  venlafaxine XR (EFFEXOR-XR) 150 MG 24 hr capsule Take 1 capsule (150 mg total) by mouth daily with breakfast. 01/17/18  Yes Leotis ShamesJeffery, Chelle, PA-C    Allergies  Allergen Reactions  . Pregabalin Swelling    Swelling   . Vicodin [Hydrocodone-Acetaminophen]     Patient Active Problem List   Diagnosis Date Noted  . Adjustment disorder with mixed anxiety and depressed mood 11/29/2017  . Grief reaction 11/29/2017  . Fibromyalgia 02/06/2016  . Snoring 02/06/2016  . Morbid obesity with BMI of 40.0-44.9, adult (HCC) 09/05/2014  . Smoker 09/05/2014    Past Medical History:  Diagnosis Date  . Allergy   . Anxiety   . Arthritis   . Depression   . Fibromyalgia     Past Surgical History:  Procedure Laterality Date  . SPINE SURGERY  Social History   Socioeconomic History  . Marital status: Widowed    Spouse name: [name] (died 10-31-2017)  . Number of children: 2  . Years of education: Not on file  . Highest education level: GED or equivalent  Occupational History  . 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 Financial resource strain: Not on file  . Food insecurity:    Worry: Not on file    Inability: Not on file  . Transportation needs:    Medical: Not on file    Non-medical: Not on file  Tobacco Use  . Smoking status: Current Every Day Smoker    Packs/day: 1.00    Years: 25.00    Pack years: 25.00  . Smokeless tobacco: Never Used  Substance and Sexual Activity  . Alcohol use: No  . Drug use: No  . Sexual  activity: Not Currently    Birth control/protection: IUD  Lifestyle  . Physical activity:    Days per week: Not on file    Minutes per session: Not on file  . Stress: Not on file  Relationships  . Social connections:    Talks on phone: Not on file    Gets together: Not on file    Attends religious service: Not on file    Active member of club or organization: Not on file    Attends meetings of clubs or organizations: Not on file    Relationship status: Not on file  . Intimate partner violence:    Fear of current or ex partner: Not on file    Emotionally abused: Not on file    Physically abused: Not on file    Forced sexual activity: Not on file  Other Topics Concern  . Not on file  Social History Narrative   Lived with her husband until his unexpected death of AMI 2017-10-31.   Her daughter and 2 grandchildren live with her.    Family History  Problem Relation Age of Onset  . Congestive Heart Failure Mother   . COPD Mother   . Diabetes Mother   . Heart disease Mother   . Hyperlipidemia Mother   . Hypertension Mother   . Diabetes Father   . Heart disease Father   . Hyperlipidemia Father   . Alcohol abuse Brother      Review of Systems  Constitutional: Negative.  Negative for chills, fever and weight loss.  HENT: Negative.  Negative for congestion, ear discharge, ear pain, hearing loss, nosebleeds and sore throat.   Eyes: Negative.  Negative for blurred vision, double vision, discharge and redness.  Respiratory: Negative.  Negative for cough, shortness of breath and wheezing.   Cardiovascular: Negative.  Negative for chest pain and palpitations.  Gastrointestinal: Negative.  Negative for abdominal pain, blood in stool, diarrhea, melena, nausea and vomiting.  Genitourinary: Negative.  Negative for dysuria.  Musculoskeletal: Negative.  Negative for back pain, myalgias and neck pain.  Skin: Negative.  Negative for rash.  Neurological: Positive for dizziness and loss of  consciousness. Negative for sensory change, speech change, focal weakness, seizures, weakness and headaches.  Endo/Heme/Allergies: Negative.   Psychiatric/Behavioral: Negative for depression and substance abuse. The patient is not nervous/anxious.     Vitals:   04/13/18 1026  BP: 104/69  Pulse: 78  Temp: 98.4 F (36.9 C)  SpO2: 96%    Physical Exam  Constitutional: She is oriented to person, place, and time. She appears well-developed and well-nourished.  HENT:  Head: Normocephalic and atraumatic.  Nose: Nose normal.  Mouth/Throat: Oropharynx is clear and moist.  Eyes: Pupils are equal, round, and reactive to light. Conjunctivae and EOM are normal.  Neck: Normal range of motion. Neck supple. No JVD present. No thyromegaly present.  Cardiovascular: Normal rate, regular rhythm, normal heart sounds and intact distal pulses.  Pulmonary/Chest: Effort normal and breath sounds normal.  Abdominal: Soft. Bowel sounds are normal. She exhibits no distension. There is no tenderness.  Musculoskeletal: Normal range of motion. She exhibits no edema or tenderness.  Lymphadenopathy:    She has no cervical adenopathy.  Neurological: She is alert and oriented to person, place, and time. She displays normal reflexes. No cranial nerve deficit or sensory deficit. She exhibits normal muscle tone. Coordination normal.  Skin: Skin is warm and dry. Capillary refill takes less than 2 seconds.  Psychiatric: She has a normal mood and affect. Her behavior is normal.  Vitals reviewed.   EKG: Normal sinus rhythm with ventricular rate of 64/min.  No acute ischemic changes.  Normal intervals.  Normal EKG.  A total of 40 minutes was spent in the room with the patient, greater than 50% of which was in counseling/coordination of care regarding differential diagnosis, management, prognosis and need for follow-up.  Syncope Clinically stable.  No red flag signs or symptoms. Possible that she  had a brief hypotensive  episode or a hypoglycemic attack.  No seizure activity noted during the episode.  No signs of CVA.  Cardiac arrhythmia also a possibility.  EKG looks normal.  We will do labs today.  Will monitor symptoms and follow-up as needed.   ASSESSMENT & PLAN: [name] was seen today for fall.  Diagnoses and all orders for this visit:  Syncope, unspecified syncope type -     EKG 12-Lead -     CBC with Differential/Platelet -     Comprehensive metabolic panel -     Hemoglobin A1c -     EKG 12-Lead    Patient Instructions       I will contact you with your lab results within the next 2 weeks.  If you have not heard from [place] then please contact us. The fastest way to get your results is to register for My Chart.   IF you received an x-ray today, you will receive an invoice from [facility]. Please contact [facility] at [phone] with questions or concerns regarding your invoice.   IF you received labwork today, you will receive an invoice from [facility]. Please contact [facility] at [phone] with questions or concerns regarding your invoice.   Our billing staff will not be able to assist you with questions regarding bills from these companies.  You will be contacted with the lab results as soon as they are available. The fastest way to get your results is to activate your My Chart account. Instructions are located on the last page of this paperwork. If you have not heard from [place] regarding the results in 2 weeks, please contact this office.     Syncope Syncope is when you lose temporarily pass out (faint). Signs that you may be about to pass out include:  Feeling dizzy or light-headed.  Feeling sick to your stomach (nauseous).  Seeing all white or all black.  Having cold, clammy skin.  If you passed out, get help right away. Call your local emergency services (911 in the [place] Do not drive yourself to the hospital. Follow these instructions at home: Pay  attention to any changes in your  symptoms. Take these actions to help with your condition:  Have someone stay with you until you feel stable.  Do not drive, use machinery, or play sports until your doctor says it is okay.  Keep all follow-up visits as told by your doctor. This is important.  If you start to feel like you might pass out, lie down right away and raise (elevate) your feet above the level of your heart. Breathe deeply and steadily. Wait until all of the symptoms are gone.  Drink enough fluid to keep your pee (urine) clear or pale yellow.  If you are taking blood pressure or heart medicine, get up slowly and spend many minutes getting ready to sit and then stand. This can help with dizziness.  Take over-the-counter and prescription medicines only as told by your doctor.  Get help right away if:  You have a very bad headache.  You have unusual pain in your chest, tummy, or back.  You are bleeding from your mouth or rectum.  You have black or tarry poop (stool).  You have a very fast or uneven heartbeat (palpitations).  It hurts to breathe.  You pass out once or more than once.  You have jerky movements that you cannot control (seizure).  You are confused.  You have trouble walking.  You are very weak.  You have vision problems. These symptoms may be an emergency. Do not wait to see if the symptoms will go away. Get medical help right away. Call your local emergency services (911 in the [place] Do not drive yourself to the hospital. This information is not intended to replace advice given to you by your health care provider. Make sure you discuss any questions you have with your health care provider. Document Released: 02/02/2008 Document Revised: 01/22/2016 Document Reviewed: 04/30/2015 Elsevier Interactive Patient Education  2018 Elsevier Inc.       Edwina BarthMiguel Avery Eustice, MD [facility]

## 2018-04-13 NOTE — Patient Instructions (Addendum)
     I will contact you with your lab results within the next 2 weeks.  If you have not heard from us then please contact us. The fastest way to get your results is to register for My Chart.   IF you received an x-ray today, you will receive an invoice from [facility]. Please contact [facility] at [phone] with questions or concerns regarding your invoice.   IF you received labwork today, you will receive an invoice from [facility]. Please contact [facility] at [phone] with questions or concerns regarding your invoice.   Our billing staff will not be able to assist you with questions regarding bills from these companies.  You will be contacted with the lab results as soon as they are available. The fastest way to get your results is to activate your My Chart account. Instructions are located on the last page of this paperwork. If you have not heard from us regarding the results in 2 weeks, please contact this office.     Syncope Syncope is when you lose temporarily pass out (faint). Signs that you may be about to pass out include:  Feeling dizzy or light-headed.  Feeling sick to your stomach (nauseous).  Seeing all white or all black.  Having cold, clammy skin.  If you passed out, get help right away. Call your local emergency services (911 in the [place] Do not drive yourself to the hospital. Follow these instructions at home: Pay attention to any changes in your symptoms. Take these actions to help with your condition:  Have someone stay with you until you feel stable.  Do not drive, use machinery, or play sports until your doctor says it is okay.  Keep all follow-up visits as told by your doctor. This is important.  If you start to feel like you might pass out, lie down right away and raise (elevate) your feet above the level of your heart. Breathe deeply and steadily. Wait until all of the symptoms are gone.  Drink enough fluid to keep your pee  (urine) clear or pale yellow.  If you are taking blood pressure or heart medicine, get up slowly and spend many minutes getting ready to sit and then stand. This can help with dizziness.  Take over-the-counter and prescription medicines only as told by your doctor.  Get help right away if:  You have a very bad headache.  You have unusual pain in your chest, tummy, or back.  You are bleeding from your mouth or rectum.  You have black or tarry poop (stool).  You have a very fast or uneven heartbeat (palpitations).  It hurts to breathe.  You pass out once or more than once.  You have jerky movements that you cannot control (seizure).  You are confused.  You have trouble walking.  You are very weak.  You have vision problems. These symptoms may be an emergency. Do not wait to see if the symptoms will go away. Get medical help right away. Call your local emergency services (911 in the [place] Do not drive yourself to the hospital. This information is not intended to replace advice given to you by your health care provider. Make sure you discuss any questions you have with your health care provider. Document Released: 02/02/2008 Document Revised: 01/22/2016 Document Reviewed: 04/30/2015 Elsevier Interactive Patient Education  [employer]2018 Elsevier Inc.

## 2018-04-14 ENCOUNTER — Encounter: Payer: Self-pay | Admitting: Emergency Medicine

## 2018-04-14 DIAGNOSIS — R55 Syncope and collapse: Secondary | ICD-10-CM | POA: Insufficient documentation

## 2018-04-14 NOTE — Assessment & Plan Note (Signed)
Clinically stable.  No red flag signs or symptoms. Possible that she  had a brief hypotensive episode or a hypoglycemic attack.  No seizure activity noted during the episode.  No signs of CVA.  Cardiac arrhythmia also a possibility.  EKG looks normal.  We will do labs today.  Will monitor symptoms and follow-up as needed.

## 2018-05-15 ENCOUNTER — Other Ambulatory Visit: Payer: Self-pay | Admitting: Family Medicine

## 2018-05-15 ENCOUNTER — Encounter: Payer: Self-pay | Admitting: Family Medicine

## 2018-05-15 NOTE — Telephone Encounter (Signed)
Nicotine 21 mg/24hr patch  refill Last Refill:03/17/18 # 28 patches Last OV: 04/13/18 PCP: Solmon IceI. Santiago Pharmacy: [facility] [phone]

## 2018-07-07 ENCOUNTER — Encounter: Payer: Self-pay | Admitting: Family Medicine

## 2018-07-07 ENCOUNTER — Ambulatory Visit: Payer: 59 | Admitting: Family Medicine

## 2018-07-07 ENCOUNTER — Ambulatory Visit (INDEPENDENT_AMBULATORY_CARE_PROVIDER_SITE_OTHER): Payer: 59

## 2018-07-07 VITALS — BP 104/69 | HR 81 | Temp 98.0°F | Resp 16 | Ht 61.0 in | Wt 230.0 lb

## 2018-07-07 DIAGNOSIS — M542 Cervicalgia: Secondary | ICD-10-CM

## 2018-07-07 DIAGNOSIS — Z30431 Encounter for routine checking of intrauterine contraceptive device: Secondary | ICD-10-CM | POA: Diagnosis not present

## 2018-07-07 DIAGNOSIS — M5412 Radiculopathy, cervical region: Secondary | ICD-10-CM

## 2018-07-07 DIAGNOSIS — R110 Nausea: Secondary | ICD-10-CM

## 2018-07-07 DIAGNOSIS — Z23 Encounter for immunization: Secondary | ICD-10-CM | POA: Diagnosis not present

## 2018-07-07 DIAGNOSIS — M4802 Spinal stenosis, cervical region: Secondary | ICD-10-CM | POA: Diagnosis not present

## 2018-07-07 LAB — POCT URINE PREGNANCY: Preg Test, Ur: NEGATIVE

## 2018-07-07 MED ORDER — METHOCARBAMOL 500 MG PO TABS: 500.0000 mg | ORAL_TABLET | Freq: Three times a day (TID) | ORAL | 1 refills | Status: DC | PRN

## 2018-07-07 MED ORDER — GABAPENTIN 100 MG PO CAPS: 100.0000 mg | ORAL_CAPSULE | Freq: Three times a day (TID) | ORAL | 3 refills | Status: AC

## 2018-07-07 NOTE — Progress Notes (Signed)
11/8/20193:09 PM  [name] May 05, 1971, 47 y.o. female [id number]  Chief Complaint  Patient presents with  . Follow-up    on IUD/ pt states she has some light bleeding, but is ok  . Shoulder Pain    right/ x 90month, pain goes down to fingers    HPI:   Patient is a 47 y.o. female who presents today for IUD check  mirena IUD placed 04/06/2018 for DUB Overall doing well, having some intermittent regular mild spotting  Denies any pelvic pain, fever or chills Having nausea since IUD placed, but also having increased PND However concerned about sexual activity just prior to IUD placment  Having right shoulder blade pain Has been doing resistance bands for past several months, increased resistance a month ago Numb, tingling pain that travels down back of her arm down to her 5th and 4th fingers H/o c6-c7 fusion Taking ibu and tylenol with partial relief  Fall Risk  07/07/2018 04/13/2018 04/06/2018 03/17/2018 01/17/2018  Falls in the past year? 0 No No No No     Depression screen [facility] 2/9 07/07/2018 04/13/2018 04/06/2018  Decreased Interest 0 0 0  Down, Depressed, Hopeless 0 0 0  PHQ - 2 Score 0 0 0    Allergies  Allergen Reactions  . Pregabalin Swelling    Swelling   . Vicodin [Hydrocodone-Acetaminophen]     Prior to Admission medications   Medication Sig Start Date End Date Taking? Authorizing Provider  ALPRAZolam (XANAX) 0.25 MG tablet Take 1 tablet (0.25 mg total) by mouth 3 (three) times daily as needed for anxiety. 02/16/18  Yes Jeffery, Chelle, PA  azelastine (ASTELIN) 0.1 % nasal spray Place 2 sprays into both nostrils 2 (two) times daily. Use in each nostril as directed 01/17/18  Yes Jeffery, Chelle, PA  doxycycline (VIBRA-TABS) 100 MG tablet Take 1 tablet (100 mg total) by mouth 2 (two) times daily. 04/06/18  Yes Myles Lipps, MD  fluticasone [facility]) 50 MCG/ACT nasal spray Place 2 sprays into both nostrils daily. 09/03/14  Yes Emi Belfast, FNP  ibuprofen  (ADVIL,MOTRIN) 800 MG tablet TK 1 T PO TID PRN 04/17/17  Yes [provider]  levonorgestrel (MIRENA) 20 MCG/24HR IUD 1 each by Intrauterine route once.   Yes [provider]  nicotine (NICODERM CQ - DOSED IN MG/24 HOURS) 21 mg/24hr patch APPLY 1 PATCH(21 MG) EXTERNALLY TO THE SKIN DAILY 05/16/18  Yes Myles Lipps, MD  venlafaxine XR (EFFEXOR-XR) 150 MG 24 hr capsule Take 1 capsule (150 mg total) by mouth daily with breakfast. 01/17/18  Yes Porfirio Oar, PA    Past Medical History:  Diagnosis Date  . Allergy   . Anxiety   . Arthritis   . Depression   . Fibromyalgia     Past Surgical History:  Procedure Laterality Date  . SPINE SURGERY      Social History   Tobacco Use  . Smoking status: Current Every Day Smoker    Packs/day: 1.00    Years: 25.00    Pack years: 25.00  . Smokeless tobacco: Never Used  Substance Use Topics  . Alcohol use: No    Family History  Problem Relation Age of Onset  . Congestive Heart Failure Mother   . COPD Mother   . Diabetes Mother   . Heart disease Mother   . Hyperlipidemia Mother   . Hypertension Mother   . Diabetes Father   . Heart disease Father   . Hyperlipidemia Father   .  Alcohol abuse Brother     ROS Per hpi  OBJECTIVE:  Blood pressure 104/69, pulse 81, temperature 98 F (36.7 C), temperature source Oral, resp. rate 16, height 5\' 1"  (1.549 m), weight 230 lb (104.3 kg), SpO2 97 %. Body mass index is 43.46 kg/m.    Physical Exam  Constitutional: She is oriented to person, place, and time. She appears well-developed and well-nourished.  HENT:  Head: Normocephalic and atraumatic.  Mouth/Throat: Mucous membranes are normal.  Eyes: Pupils are equal, round, and reactive to light. Conjunctivae and EOM are normal. No scleral icterus.  Neck: Normal range of motion. Spinous process tenderness and muscular tenderness present.  Pain with extension and rightward rotation  Pulmonary/Chest: Effort normal.    Genitourinary: There is no rash or lesion on the right labia. There is no rash or lesion on the left labia. Uterus is not tender. Cervix exhibits no motion tenderness, no discharge and no friability. No erythema in the vagina. No vaginal discharge found.  Genitourinary Comments: IUD strings visualized at cervical os  Musculoskeletal:       Right shoulder: Normal.       Arms: Negative right elbow flexion test  Neurological: She is alert and oriented to person, place, and time.  Skin: Skin is warm and dry.  Psychiatric: She has a normal mood and affect.  Nursing note and vitals reviewed.   Results for orders placed or performed in visit on 07/07/18 (from the past 24 hour(s))  POCT urine pregnancy     Status: None   Collection Time: 07/07/18  4:00 PM  Result Value Ref Range   Preg Test, Ur Negative Negative    Dg Cervical Spine Complete  Result Date: 07/07/2018 CLINICAL DATA:  Right upper extremity radiculopathy. EXAM: CERVICAL SPINE - COMPLETE 4+ VIEW COMPARISON:  None. FINDINGS: There is no evidence of cervical spine fracture or prevertebral soft tissue swelling. Alignment is normal. Patient status post prior fusion of C6 and C7 without malalignment. There is mild narrowing of the right C3-4, C4-5, left C4-5, C5-6, C6-7 neural foramina due to osteophyte encroachment. 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 No acute fracture or dislocation identified. Electronically Signed   By: Sherian Rein M.D.   On: 07/07/2018 16:06     ASSESSMENT and PLAN  1. IUD check up Strings in place, content with method  2. Nausea without vomiting - POCT urine pregnancy - negative  3. Right cervical radiculopathy 4. Neck pain Seems related to spasms for recent increase in exercise intensity and frequency, h/o cervical spine surgery. Discussed rest, ice, new meds r/se/b.  - DG Cervical Spine Complete; Future  5. Need for influenza vaccination - Flu Vaccine QUAD 36+ mos  IM  Other orders - methocarbamol (ROBAXIN) 500 MG tablet; Take 1 tablet (500 mg total) by mouth every 8 (eight) hours as needed for muscle spasms. - gabapentin (NEURONTIN) 100 MG capsule; Take 1 capsule (100 mg total) by mouth 3 (three) times daily.  Return if symptoms worsen or fail to improve, for next CPE due July 2020.    Myles Lipps, MD Primary Care at [facility] [address] [place], [place] [address] Ph.  [phone] Fax [phone]

## 2018-07-07 NOTE — Patient Instructions (Signed)
° ° ° °  If you have lab work done today you will be contacted with your lab results within the next 2 weeks.  If you have not heard from us then please contact us. The fastest way to get your results is to register for My Chart. ° ° °IF you received an x-ray today, you will receive an invoice from [facility]. Please contact [facility] at [phone] with questions or concerns regarding your invoice.  ° °IF you received labwork today, you will receive an invoice from [facility]. Please contact [facility] at [phone] with questions or concerns regarding your invoice.  ° °Our billing staff will not be able to assist you with questions regarding bills from these companies. ° °You will be contacted with the lab results as soon as they are available. The fastest way to get your results is to activate your My Chart account. Instructions are located on the last page of this paperwork. If you have not heard from us regarding the results in 2 weeks, please contact this office. °  ° ° ° °

## 2018-07-10 ENCOUNTER — Encounter: Payer: Self-pay | Admitting: Family Medicine

## 2018-08-22 ENCOUNTER — Other Ambulatory Visit: Payer: Self-pay | Admitting: Family Medicine

## 2018-08-22 NOTE — Telephone Encounter (Signed)
Requested medication (s) are due for refill today: yes  Requested medication (s) are on the active medication list: yes  Last refill:  07/07/18  Future visit scheduled: no  Notes to clinic:  NT cannot filled med - [facility]   Requested Prescriptions  Pending Prescriptions Disp Refills   methocarbamol (ROBAXIN) 500 MG tablet [Pharmacy Med Name: METHOCARBAMOL 500MG  TABLETS] 30 tablet 1    Sig: TAKE 1 TABLET(500 MG) BY MOUTH EVERY 8 HOURS AS NEEDED FOR MUSCLE SPASMS     Not Delegated - Analgesics:  Muscle Relaxants Failed - 08/22/2018  3:14 AM      Failed - This refill cannot be delegated      Passed - Valid encounter within last 6 months    Recent Outpatient Visits          1 month ago IUD check up   [facility], Meda CoffeeIrma M, MD   4 months ago Syncope, unspecified syncope type   [facility], Eilleen KempfMiguel Jose, MD   4 months ago Encounter for IUD removal and reinsertion   [facility], Meda CoffeeIrma M, MD   5 months ago Women's annual routine gynecological examination   [facility], Meda CoffeeIrma M, MD   7 months ago Grief reaction   [facility], [place], [place]

## 2019-04-05 ENCOUNTER — Encounter: Payer: Self-pay | Admitting: Emergency Medicine

## 2019-04-05 ENCOUNTER — Telehealth (INDEPENDENT_AMBULATORY_CARE_PROVIDER_SITE_OTHER): Payer: 59 | Admitting: Emergency Medicine

## 2019-04-05 ENCOUNTER — Other Ambulatory Visit: Payer: Self-pay

## 2019-04-05 DIAGNOSIS — F4323 Adjustment disorder with mixed anxiety and depressed mood: Secondary | ICD-10-CM

## 2019-04-05 DIAGNOSIS — R69 Illness, unspecified: Secondary | ICD-10-CM | POA: Diagnosis not present

## 2019-04-05 MED ORDER — ALPRAZOLAM 0.25 MG PO TABS: 0.2500 mg | ORAL_TABLET | Freq: Two times a day (BID) | ORAL | 0 refills | Status: DC | PRN

## 2019-04-05 MED ORDER — METHOCARBAMOL 500 MG PO TABS: 500.0000 mg | ORAL_TABLET | Freq: Three times a day (TID) | ORAL | 1 refills | Status: AC | PRN

## 2019-04-05 MED ORDER — VENLAFAXINE HCL ER 150 MG PO CP24: 150.0000 mg | ORAL_CAPSULE | Freq: Every day | ORAL | 3 refills | Status: DC

## 2019-04-05 NOTE — Progress Notes (Signed)
Telemedicine Encounter- SOAP NOTE Established Patient  This telephone encounter was conducted with the patient's (or proxy's) verbal consent via audio telecommunications: yes/no: Yes Patient was instructed to have this encounter in a suitably private space; and to only have persons present to whom they give permission to participate. In addition, patient identity was confirmed by use of name plus two identifiers (DOB and address).  I discussed the limitations, risks, security and privacy concerns of performing an evaluation and management service by telephone and the availability of in person appointments. I also discussed with the patient that there may be a patient responsible charge related to this service. The patient expressed understanding and agreed to proceed.   No chief complaint on file. Medication refills  Subjective   [name] is a 48 y.o. female established patient. Telephone visit today for follow-up of depression/anxiety and medication refills.  Doing well.  Has no complaints or medical concerns today.  HPI   Patient Active Problem List   Diagnosis Date Noted  . Syncope 04/14/2018  . Adjustment disorder with mixed anxiety and depressed mood 11/29/2017  . Grief reaction 11/29/2017  . Fibromyalgia 02/06/2016  . Snoring 02/06/2016  . Morbid obesity with BMI of 40.0-44.9, adult ([place]) 09/05/2014  . Smoker 09/05/2014    Past Medical History:  Diagnosis Date  . Allergy   . Anxiety   . Arthritis   . Depression   . Fibromyalgia     Current Outpatient Medications  Medication Sig Dispense Refill  . ALPRAZolam (XANAX) 0.25 MG tablet Take 1 tablet (0.25 mg total) by mouth 3 (three) times daily as needed for anxiety. 60 tablet 0  . azelastine (ASTELIN) 0.1 % nasal spray Place 2 sprays into both nostrils 2 (two) times daily. Use in each nostril as directed 30 mL 0  . fluticasone (FLONASE) 50 MCG/ACT nasal spray Place 2 sprays into both nostrils daily. 16 g 3  .  ibuprofen (ADVIL,MOTRIN) 800 MG tablet TK 1 T PO TID PRN  0  . levonorgestrel (MIRENA) 20 MCG/24HR IUD 1 each by Intrauterine route once.    . methocarbamol (ROBAXIN) 500 MG tablet TAKE 1 TABLET(500 MG) BY MOUTH EVERY 8 HOURS AS NEEDED FOR MUSCLE SPASMS 30 tablet 1  . venlafaxine XR (EFFEXOR-XR) 150 MG 24 hr capsule Take 1 capsule (150 mg total) by mouth daily with breakfast. 90 capsule 3  . doxycycline (VIBRA-TABS) 100 MG tablet Take 1 tablet (100 mg total) by mouth 2 (two) times daily. (Patient not taking: Reported on 04/05/2019) 6 tablet 0  . gabapentin (NEURONTIN) 100 MG capsule Take 1 capsule (100 mg total) by mouth 3 (three) times daily. (Patient not taking: Reported on 04/05/2019) 90 capsule 3  . nicotine (NICODERM CQ - DOSED IN MG/24 HOURS) 21 mg/24hr patch APPLY 1 PATCH(21 MG) EXTERNALLY TO THE SKIN DAILY (Patient not taking: Reported on 04/05/2019) 28 patch 0   No current facility-administered medications for this visit.     Allergies  Allergen Reactions  . Pregabalin Swelling    Swelling   . Vicodin [Hydrocodone-Acetaminophen]     Social History   Socioeconomic History  . Marital status: Widowed    Spouse name: Jonni Sanger (died 10/29/2017)  . Number of children: 2  . Years of education: Not on file  . Highest education level: GED or equivalent  Occupational History  . 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 Financial resource strain: Not on file  . Food insecurity    Worry: Not on  file    Inability: Not on file  . Transportation needs    Medical: Not on file    Non-medical: Not on file  Tobacco Use  . Smoking status: Current Every Day Smoker    Packs/day: 1.00    Years: 25.00    Pack years: 25.00  . Smokeless tobacco: Never Used  Substance and Sexual Activity  . Alcohol use: No  . Drug use: No  . Sexual activity: Not Currently    Birth control/protection: I.U.D.  Lifestyle  . Physical activity    Days per week: Not on file    Minutes per session: Not on file  .  Stress: Not on file  Relationships  . Social Musicianconnections    Talks on phone: Not on file    Gets together: Not on file    Attends religious service: Not on file    Active member of club or organization: Not on file    Attends meetings of clubs or organizations: Not on file    Relationship status: Not on file  . Intimate partner violence    Fear of current or ex partner: Not on file    Emotionally abused: Not on file    Physically abused: Not on file    Forced sexual activity: Not on file  Other Topics Concern  . Not on file  Social History Narrative   Lived with her husband until his unexpected death of AMI 10/28/2017.   Her daughter and 2 grandchildren live with her.    Review of Systems  Constitutional: Negative.  Negative for chills and fever.  HENT: Negative.  Negative for congestion and sore throat.   Eyes: Negative.   Respiratory: Negative.  Negative for cough and shortness of breath.   Cardiovascular: Negative.  Negative for chest pain and palpitations.  Gastrointestinal: Negative.  Negative for abdominal pain, diarrhea, nausea and vomiting.  Genitourinary: Negative.   Skin: Negative.  Negative for rash.  Neurological: Negative.  Negative for dizziness and headaches.  Endo/Heme/Allergies: Negative.   All other systems reviewed and are negative.   Objective  Alert and oriented x3 in no apparent respiratory distress. Vitals as reported by the patient: There were no vitals filed for this visit.  There are no diagnoses linked to this encounter.  Diagnoses and all orders for this visit:  Adjustment disorder with mixed anxiety and depressed mood -     venlafaxine XR (EFFEXOR-XR) 150 MG 24 hr capsule; Take 1 capsule (150 mg total) by mouth daily with breakfast. -     ALPRAZolam (XANAX) 0.25 MG tablet; Take 1 tablet (0.25 mg total) by mouth 2 (two) times daily as needed for anxiety.  Other orders -     methocarbamol (ROBAXIN) 500 MG tablet; Take 1 tablet (500 mg total) by  mouth every 8 (eight) hours as needed for muscle spasms.    Clinically stable.  No medical concerns identified during this visit.  Continue present medications.  No changes.  Office visit in 6 months.  I discussed the assessment and treatment plan with the patient. The patient was provided an opportunity to ask questions and all were answered. The patient agreed with the plan and demonstrated an understanding of the instructions.   The patient was advised to call back or seek an in-person evaluation if the symptoms worsen or if the condition fails to improve as anticipated.    Georgina QuintMiguel Jose Tacha Manni, MD  Primary Care at [facility]

## 2019-04-05 NOTE — Progress Notes (Signed)
CC: Medication refill on xanax, robaxin and venlafaxine.  No travel outside the [place] or [place] in the past 3 weeks.  No recent weight or bp taken.  No other concerns for provider.

## 2019-04-05 NOTE — Patient Instructions (Signed)
° ° ° °  If you have lab work done today you will be contacted with your lab results within the next 2 weeks.  If you have not heard from us then please contact us. The fastest way to get your results is to register for My Chart. ° ° °IF you received an x-ray today, you will receive an invoice from [facility]. Please contact [facility] at [phone] with questions or concerns regarding your invoice.  ° °IF you received labwork today, you will receive an invoice from [facility]. Please contact [facility] at [phone] with questions or concerns regarding your invoice.  ° °Our billing staff will not be able to assist you with questions regarding bills from these companies. ° °You will be contacted with the lab results as soon as they are available. The fastest way to get your results is to activate your My Chart account. Instructions are located on the last page of this paperwork. If you have not heard from us regarding the results in 2 weeks, please contact this office. °  ° ° ° °

## 2020-02-04 ENCOUNTER — Other Ambulatory Visit: Payer: Self-pay | Admitting: Emergency Medicine

## 2020-02-04 DIAGNOSIS — F4323 Adjustment disorder with mixed anxiety and depressed mood: Secondary | ICD-10-CM

## 2020-02-04 NOTE — Telephone Encounter (Signed)
Patient is requesting a refill of the following medications: Requested Prescriptions   Pending Prescriptions Disp Refills  . ALPRAZolam (XANAX) 0.25 MG tablet [Pharmacy Med Name: ALPRAZOLAM 0.25MG  TABLETS] 60 tablet     Sig: TAKE 1 TABLET BY MOUTH TWICE DAILY AS NEEDED FOR ANXIETY    Date of patient request: 02/04/2020 Last office visit: 04/05/2019 TELEMED Date of last refill: 04/05/2019 Last refill amount: 60 tablets  Follow up time period per chart: N/A

## 2020-02-04 NOTE — Telephone Encounter (Signed)
pmp reviewd, appropriate meds refilled 

## 2020-02-04 NOTE — Telephone Encounter (Signed)
Requested medication (s) are due for refill today: yes  Requested medication (s) are on the active medication list: yes  Last refill:  04/05/2019  Future visit scheduled: no  Notes to clinic:  not delegated; no valid encounter within last 6 months    Requested Prescriptions  Pending Prescriptions Disp Refills   ALPRAZolam (XANAX) 0.25 MG tablet [Pharmacy Med Name: ALPRAZOLAM 0.25MG  TABLETS] 60 tablet     Sig: TAKE 1 TABLET BY MOUTH TWICE DAILY AS NEEDED FOR ANXIETY      Not Delegated - Psychiatry:  Anxiolytics/Hypnotics Failed - 02/04/2020 11:53 AM      Failed - This refill cannot be delegated      Failed - Urine Drug Screen completed in last 360 days.      Failed - Valid encounter within last 6 months    Recent Outpatient Visits           10 months ago Adjustment disorder with mixed anxiety and depressed mood   [facility] Care at [facility], Eilleen Kempf, MD   1 year ago IUD check up   [facility], Meda Coffee, MD   1 year ago Syncope, unspecified syncope type   Primary Care at [facility], Eilleen Kempf, MD   1 year ago Encounter for IUD removal and reinsertion   [facility], Meda Coffee, MD   1 year ago Women's annual routine gynecological examination   [facility], Meda Coffee, MD

## 2020-05-06 ENCOUNTER — Other Ambulatory Visit: Payer: Self-pay | Admitting: Emergency Medicine

## 2020-05-06 DIAGNOSIS — F4323 Adjustment disorder with mixed anxiety and depressed mood: Secondary | ICD-10-CM

## 2020-05-06 NOTE — Telephone Encounter (Signed)
Requested medication (s) are due for refill today: no  Requested medication (s) are on the active medication list: yes   Last refill: 02/04/2020  Future visit scheduled:  no  Notes to clinic:  overdue for follow up    Requested Prescriptions  Pending Prescriptions Disp Refills   venlafaxine XR (EFFEXOR-XR) 150 MG 24 hr capsule [Pharmacy Med Name: VENLAFAXINE 150MG  ER CAPSULES] 90 capsule 3    Sig: TAKE ONE CAPSULE BY MOUTH DAILY WITH BREAKFAST      Psychiatry: Antidepressants - SNRI - desvenlafaxine & venlafaxine Failed - 05/06/2020  3:30 AM      Failed - LDL in normal range and within 360 days    LDL Calculated  Date Value Ref Range Status  03/17/2018 93 0 - 99 mg/dL Final          Failed - Total Cholesterol in normal range and within 360 days    Cholesterol, Total  Date Value Ref Range Status  03/17/2018 151 100 - 199 mg/dL Final          Failed - Triglycerides in normal range and within 360 days    Triglycerides  Date Value Ref Range Status  03/17/2018 102 0 - 149 mg/dL Final          Failed - Valid encounter within last 6 months    Recent Outpatient Visits           1 year ago Adjustment disorder with mixed anxiety and depressed mood   Primary Care at [facility], MD   1 year ago IUD check up   Primary Care at Eilleen Kempf, [facility], MD   2 years ago Syncope, unspecified syncope type   Primary Care at [facility], [facility], MD   2 years ago Encounter for IUD removal and reinsertion   Primary Care at Eilleen Kempf, [facility], MD   2 years ago Women's annual routine gynecological examination   Primary Care at Meda Coffee, [facility], MD              Passed - Last BP in normal range    BP Readings from Last 1 Encounters:  07/07/18 104/69

## 2020-05-20 DIAGNOSIS — J301 Allergic rhinitis due to pollen: Secondary | ICD-10-CM | POA: Diagnosis not present

## 2020-05-20 DIAGNOSIS — H8309 Labyrinthitis, unspecified ear: Secondary | ICD-10-CM | POA: Diagnosis not present

## 2020-05-20 DIAGNOSIS — H1030 Unspecified acute conjunctivitis, unspecified eye: Secondary | ICD-10-CM | POA: Diagnosis not present

## 2020-06-10 ENCOUNTER — Other Ambulatory Visit: Payer: Self-pay | Admitting: Family Medicine

## 2020-06-10 DIAGNOSIS — F4323 Adjustment disorder with mixed anxiety and depressed mood: Secondary | ICD-10-CM

## 2020-08-10 IMAGING — DX DG CERVICAL SPINE COMPLETE 4+V
5 series · 5 of 5 positions shown · non-contrast
Comparison: None.

CLINICAL DATA: Right upper extremity radiculopathy.

EXAM:
CERVICAL SPINE - COMPLETE 4+ VIEW

[c-spine lat]
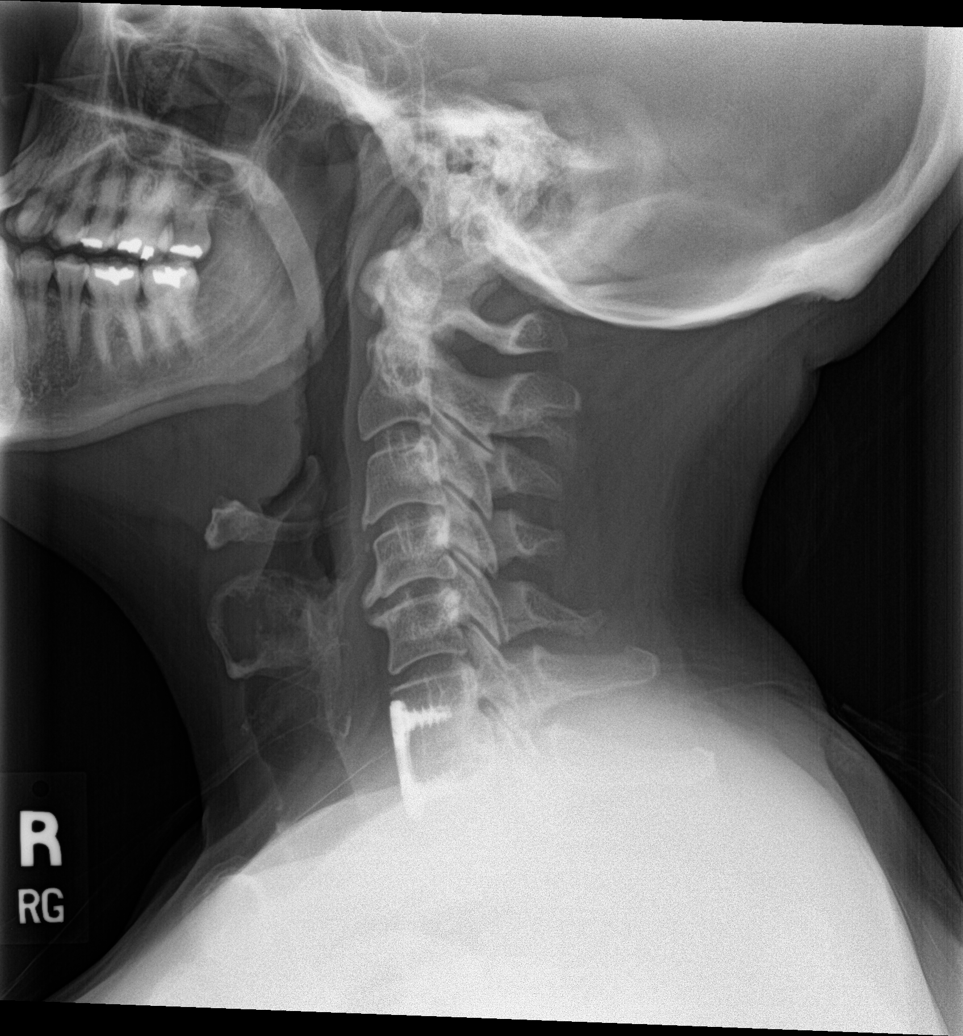

[c-spine obl (1 of 2)]
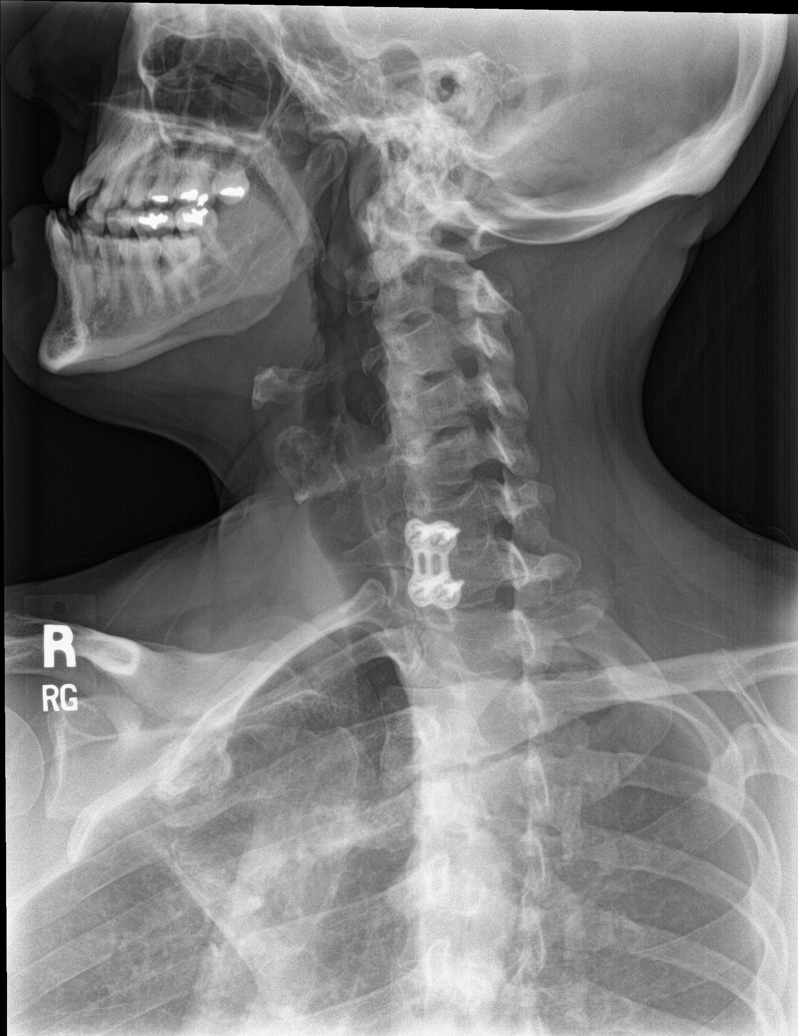

[c-spine obl (2 of 2)]
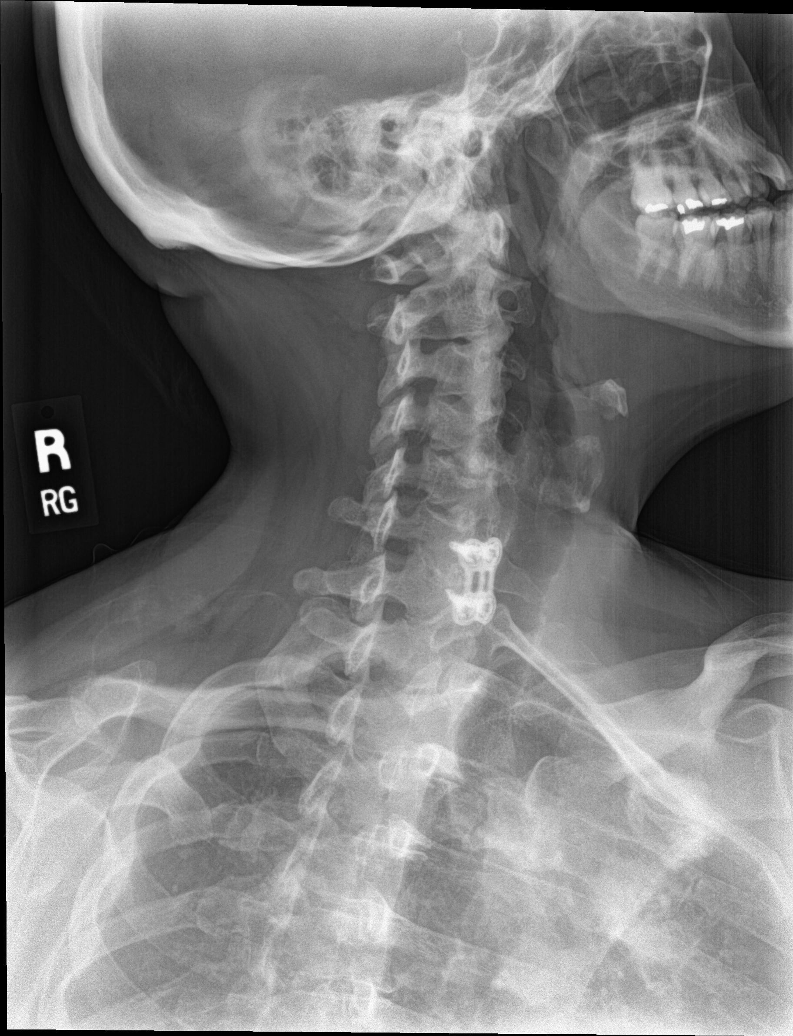

[c-spine ap]
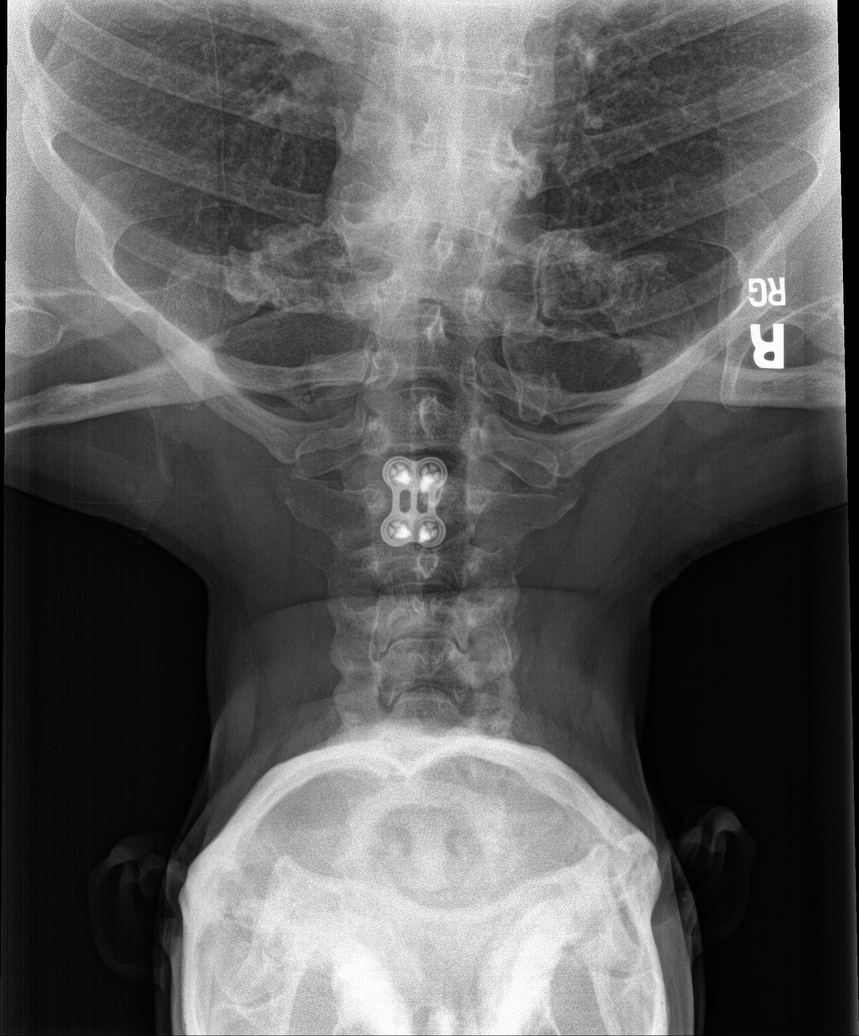

[c-spine open mouth]
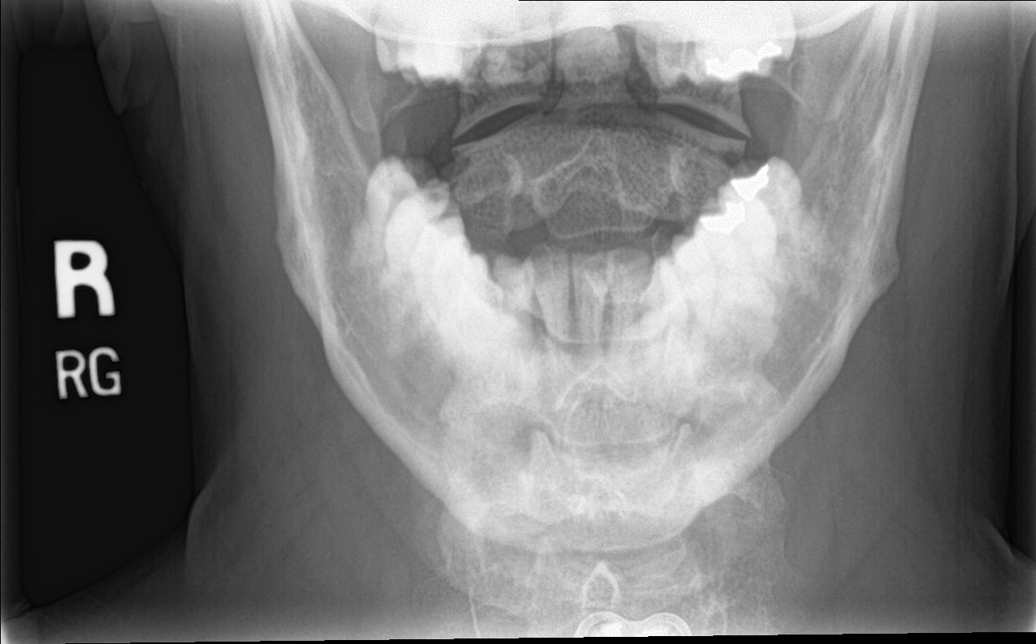

[5 of 5 positions shown; findings below may reference images not displayed]

FINDINGS: There is no evidence of cervical spine fracture or prevertebral soft
tissue swelling. Alignment is normal. Patient status post prior
fusion of C6 and C7 without malalignment. There is mild narrowing of
the right C3-4, C4-5, left C4-5, C5-6, C6-7 neural foramina due to
osteophyte encroachment.
IMPRESSION: Mild degenerative joint changes of cervical spine. No acute fracture
or dislocation identified.

## 2020-10-10 DIAGNOSIS — Z7689 Persons encountering health services in other specified circumstances: Secondary | ICD-10-CM | POA: Diagnosis not present

## 2020-10-10 DIAGNOSIS — F32A Depression, unspecified: Secondary | ICD-10-CM | POA: Diagnosis not present

## 2020-10-10 DIAGNOSIS — F419 Anxiety disorder, unspecified: Secondary | ICD-10-CM | POA: Diagnosis not present

## 2020-10-10 DIAGNOSIS — K5909 Other constipation: Secondary | ICD-10-CM | POA: Diagnosis not present

## 2020-10-10 DIAGNOSIS — K9049 Malabsorption due to intolerance, not elsewhere classified: Secondary | ICD-10-CM | POA: Diagnosis not present

## 2020-11-25 DIAGNOSIS — F32A Depression, unspecified: Secondary | ICD-10-CM | POA: Diagnosis not present

## 2020-11-25 DIAGNOSIS — F419 Anxiety disorder, unspecified: Secondary | ICD-10-CM | POA: Diagnosis not present

## 2020-11-25 DIAGNOSIS — K5909 Other constipation: Secondary | ICD-10-CM | POA: Diagnosis not present

## 2020-11-25 DIAGNOSIS — Z6841 Body Mass Index (BMI) 40.0 and over, adult: Secondary | ICD-10-CM | POA: Diagnosis not present

## 2021-10-07 DIAGNOSIS — L603 Nail dystrophy: Secondary | ICD-10-CM | POA: Diagnosis not present

## 2021-10-07 DIAGNOSIS — L820 Inflamed seborrheic keratosis: Secondary | ICD-10-CM | POA: Diagnosis not present

## 2021-10-07 DIAGNOSIS — M71342 Other bursal cyst, left hand: Secondary | ICD-10-CM | POA: Diagnosis not present

## 2021-10-07 DIAGNOSIS — D485 Neoplasm of uncertain behavior of skin: Secondary | ICD-10-CM | POA: Diagnosis not present

## 2021-10-21 DIAGNOSIS — L089 Local infection of the skin and subcutaneous tissue, unspecified: Secondary | ICD-10-CM | POA: Diagnosis not present

## 2021-10-26 DIAGNOSIS — M79642 Pain in left hand: Secondary | ICD-10-CM | POA: Diagnosis not present

## 2021-11-06 DIAGNOSIS — M545 Low back pain, unspecified: Secondary | ICD-10-CM | POA: Diagnosis not present

## 2021-11-30 DIAGNOSIS — E669 Obesity, unspecified: Secondary | ICD-10-CM | POA: Diagnosis not present

## 2021-11-30 DIAGNOSIS — D2112 Benign neoplasm of connective and other soft tissue of left upper limb, including shoulder: Secondary | ICD-10-CM | POA: Diagnosis not present

## 2021-11-30 DIAGNOSIS — R2232 Localized swelling, mass and lump, left upper limb: Secondary | ICD-10-CM | POA: Diagnosis not present

## 2021-11-30 DIAGNOSIS — G4730 Sleep apnea, unspecified: Secondary | ICD-10-CM | POA: Diagnosis not present

## 2021-11-30 DIAGNOSIS — F17210 Nicotine dependence, cigarettes, uncomplicated: Secondary | ICD-10-CM | POA: Diagnosis not present

## 2021-11-30 DIAGNOSIS — M79645 Pain in left finger(s): Secondary | ICD-10-CM | POA: Diagnosis not present

## 2021-11-30 DIAGNOSIS — R2231 Localized swelling, mass and lump, right upper limb: Secondary | ICD-10-CM | POA: Diagnosis not present

## 2021-12-07 DIAGNOSIS — M79642 Pain in left hand: Secondary | ICD-10-CM | POA: Diagnosis not present

## 2021-12-07 DIAGNOSIS — M79645 Pain in left finger(s): Secondary | ICD-10-CM | POA: Diagnosis not present

## 2022-06-30 DIAGNOSIS — H00019 Hordeolum externum unspecified eye, unspecified eyelid: Secondary | ICD-10-CM | POA: Diagnosis not present

## 2022-06-30 DIAGNOSIS — H60319 Diffuse otitis externa, unspecified ear: Secondary | ICD-10-CM | POA: Diagnosis not present

## 2022-10-01 DIAGNOSIS — Z720 Tobacco use: Secondary | ICD-10-CM | POA: Diagnosis not present

## 2023-01-28 DIAGNOSIS — S29012A Strain of muscle and tendon of back wall of thorax, initial encounter: Secondary | ICD-10-CM | POA: Diagnosis not present

## 2023-01-28 DIAGNOSIS — M542 Cervicalgia: Secondary | ICD-10-CM | POA: Diagnosis not present

## 2023-06-18 DIAGNOSIS — J069 Acute upper respiratory infection, unspecified: Secondary | ICD-10-CM | POA: Diagnosis not present

## 2024-06-06 DIAGNOSIS — J0100 Acute maxillary sinusitis, unspecified: Secondary | ICD-10-CM | POA: Diagnosis not present
